# Patient Record
Sex: Female | Born: 1985 | Race: Black or African American | Hispanic: No | Marital: Married | Smoking: Never smoker
Health system: Southern US, Community
[De-identification: ages and names within clinical notes are randomized; demographics above are authoritative.]

## PROBLEM LIST (undated history)

## (undated) DIAGNOSIS — D649 Anemia, unspecified: Secondary | ICD-10-CM

## (undated) DIAGNOSIS — B009 Herpesviral infection, unspecified: Secondary | ICD-10-CM

## (undated) DIAGNOSIS — B977 Papillomavirus as the cause of diseases classified elsewhere: Secondary | ICD-10-CM

## (undated) DIAGNOSIS — D62 Acute posthemorrhagic anemia: Secondary | ICD-10-CM

## (undated) DIAGNOSIS — N870 Mild cervical dysplasia: Secondary | ICD-10-CM

## (undated) HISTORY — DX: Herpesviral infection, unspecified: B00.9

## (undated) HISTORY — DX: Papillomavirus as the cause of diseases classified elsewhere: B97.7

## (undated) HISTORY — DX: Mild cervical dysplasia: N87.0

## (undated) HISTORY — PX: HERNIA REPAIR: SHX51

---

## 2008-04-07 ENCOUNTER — Emergency Department (HOSPITAL_COMMUNITY): Admission: EM | Admit: 2008-04-07 | Discharge: 2008-04-07 | Payer: Self-pay | Admitting: Emergency Medicine

## 2008-11-12 ENCOUNTER — Ambulatory Visit: Payer: Self-pay | Admitting: Women's Health

## 2008-11-12 ENCOUNTER — Encounter: Payer: Self-pay | Admitting: Women's Health

## 2008-11-12 ENCOUNTER — Other Ambulatory Visit: Admission: RE | Admit: 2008-11-12 | Discharge: 2008-11-12 | Payer: Self-pay | Admitting: Gynecology

## 2008-12-30 ENCOUNTER — Ambulatory Visit: Payer: Self-pay | Admitting: Women's Health

## 2009-11-14 ENCOUNTER — Other Ambulatory Visit: Admission: RE | Admit: 2009-11-14 | Discharge: 2009-11-14 | Payer: Self-pay | Admitting: Gynecology

## 2009-11-14 ENCOUNTER — Ambulatory Visit: Payer: Self-pay | Admitting: Women's Health

## 2010-02-13 ENCOUNTER — Ambulatory Visit: Payer: Self-pay | Admitting: Women's Health

## 2010-11-17 ENCOUNTER — Other Ambulatory Visit (HOSPITAL_COMMUNITY)
Admission: RE | Admit: 2010-11-17 | Discharge: 2010-11-17 | Disposition: A | Discharge status: Home / Self Care | Payer: BC Managed Care – PPO | Source: Ambulatory Visit | Attending: Gynecology | Admitting: Gynecology

## 2010-11-17 ENCOUNTER — Other Ambulatory Visit: Payer: Self-pay | Admitting: Women's Health

## 2010-11-17 ENCOUNTER — Ambulatory Visit
Admission: RE | Admit: 2010-11-17 | Discharge: 2010-11-17 | Payer: Self-pay | Source: Home / Self Care | Attending: Women's Health | Admitting: Women's Health

## 2010-11-17 DIAGNOSIS — Z124 Encounter for screening for malignant neoplasm of cervix: Secondary | ICD-10-CM | POA: Insufficient documentation

## 2010-11-17 DIAGNOSIS — R87810 Cervical high risk human papillomavirus (HPV) DNA test positive: Secondary | ICD-10-CM | POA: Insufficient documentation

## 2010-11-22 DIAGNOSIS — B977 Papillomavirus as the cause of diseases classified elsewhere: Secondary | ICD-10-CM

## 2010-11-22 HISTORY — DX: Papillomavirus as the cause of diseases classified elsewhere: B97.7

## 2010-12-01 ENCOUNTER — Ambulatory Visit: Payer: Self-pay | Admitting: Gynecology

## 2010-12-08 ENCOUNTER — Ambulatory Visit (INDEPENDENT_AMBULATORY_CARE_PROVIDER_SITE_OTHER): Payer: BC Managed Care – PPO | Admitting: Gynecology

## 2010-12-08 ENCOUNTER — Other Ambulatory Visit: Payer: Self-pay | Admitting: Gynecology

## 2010-12-08 DIAGNOSIS — R87613 High grade squamous intraepithelial lesion on cytologic smear of cervix (HGSIL): Secondary | ICD-10-CM

## 2010-12-18 DIAGNOSIS — N870 Mild cervical dysplasia: Secondary | ICD-10-CM

## 2010-12-18 HISTORY — DX: Mild cervical dysplasia: N87.0

## 2011-07-04 ENCOUNTER — Encounter: Payer: Self-pay | Admitting: Anesthesiology

## 2011-07-04 DIAGNOSIS — B009 Herpesviral infection, unspecified: Secondary | ICD-10-CM | POA: Insufficient documentation

## 2011-07-12 ENCOUNTER — Encounter: Payer: Self-pay | Admitting: Gynecology

## 2011-07-12 ENCOUNTER — Ambulatory Visit (INDEPENDENT_AMBULATORY_CARE_PROVIDER_SITE_OTHER): Payer: BC Managed Care – PPO | Admitting: Gynecology

## 2011-07-12 ENCOUNTER — Other Ambulatory Visit (HOSPITAL_COMMUNITY)
Admission: RE | Admit: 2011-07-12 | Discharge: 2011-07-12 | Disposition: A | Discharge status: Home / Self Care | Payer: BC Managed Care – PPO | Source: Ambulatory Visit | Attending: Gynecology | Admitting: Gynecology

## 2011-07-12 VITALS — BP 130/70

## 2011-07-12 DIAGNOSIS — Z01419 Encounter for gynecological examination (general) (routine) without abnormal findings: Secondary | ICD-10-CM | POA: Insufficient documentation

## 2011-07-12 DIAGNOSIS — N870 Mild cervical dysplasia: Secondary | ICD-10-CM | POA: Insufficient documentation

## 2011-07-12 DIAGNOSIS — R87810 Cervical high risk human papillomavirus (HPV) DNA test positive: Secondary | ICD-10-CM | POA: Insufficient documentation

## 2011-07-12 NOTE — Progress Notes (Signed)
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 Lines were discussed with the patient and because of her young age and her compliance she will be followed up Pap smear every 6 months and re\re colposcoped if any higher grade lesion or abnormality is noted. Patient not sexually active Pap smear was repeated. She scheduled to return back in 6 months which is due for her annual exam.

## 2011-07-12 NOTE — Patient Instructions (Signed)
CIN 1 preceded by low grade cytological abnormalities  Expectant management - Since spontaneous regression is observed in most women in this setting (ASC-US, ASC-H, LSIL), expectant management is generally preferred for the reliable patient with biopsy-confirmed CIN 1, in whom the entire lesion and limits of the transformation zone are completely visualized (ie, satisfactory colposcopic examination). Approximately 80 to 85 percent of women referred for initial colposcopy because of LSIL or HPV DNA positive ASC-US have CIN 1 or less detected on biopsy [18,26]. Overall, 9 to 16 percent of these women will have histologically confirmed CIN 2 or 3 within two years of follow-up. Fewer women with ASC-H have only CIN 1 or less detected at initial biopsy, but for those who do, the risk of subsequent progression to CIN 2,3 is 11 percent [27]. Therefore, an effective method for monitoring patients managed expectantly is important. The only randomized trial evaluating approaches to postcolposcopy monitoring of these patients concluded that HPV testing after 12 months with repeat colposcopic referral if the HPV results are positive for high risk types was more efficient than semiannual cytology or combined cytology and high risk HPV DNA testing [26]. Sensitivity to detect subsequent CIN 2,3 for a single HPV DNA test at 12 months was 92 percent, with a 55 percent rate of colposcopy referral. To achieve comparable sensitivity with the other follow-up methods, more visits would be required and with a higher rate of colposcopy referral, 64 to 70 percent. Based on these data, the American Society for Colposcopy and Cervical Pathology (ASCCP) consensus guidelines recommend expectant management of women with biopsy confirmed CIN 1 using follow-up with HPV testing at 12 months or repeat cytology at 6 and 12 months [1]. In addition: Colposcopy should be repeated if repeat cytology shows ASC or greater or HPV DNA testing is  positive for a high risk type.  After two negative smears or a negative HPV DNA test, routine screening may be resumed. Follow-up of women with CIN 1 beyond 24 months has shown that spontaneous regression or progression can occur [28]. There are no data to suggest that it is unsafe to continue close clinical follow-up of a compliant patient with persistent CIN 1, with treatment planned if there is evidence of disease progression or if the women chooses to be treated. CIN 2,3 are managed as described below (

## 2012-01-14 ENCOUNTER — Ambulatory Visit (INDEPENDENT_AMBULATORY_CARE_PROVIDER_SITE_OTHER): Payer: BC Managed Care – PPO | Admitting: Gynecology

## 2012-01-14 ENCOUNTER — Encounter: Payer: Self-pay | Admitting: Gynecology

## 2012-01-14 ENCOUNTER — Other Ambulatory Visit (HOSPITAL_COMMUNITY)
Admission: RE | Admit: 2012-01-14 | Discharge: 2012-01-14 | Disposition: A | Discharge status: Home / Self Care | Payer: BC Managed Care – PPO | Source: Ambulatory Visit | Attending: Gynecology | Admitting: Gynecology

## 2012-01-14 VITALS — BP 110/70 | Ht 61.5 in | Wt 114.0 lb

## 2012-01-14 DIAGNOSIS — Z01419 Encounter for gynecological examination (general) (routine) without abnormal findings: Secondary | ICD-10-CM | POA: Insufficient documentation

## 2012-01-14 DIAGNOSIS — IMO0001 Reserved for inherently not codable concepts without codable children: Secondary | ICD-10-CM

## 2012-01-14 DIAGNOSIS — R87810 Cervical high risk human papillomavirus (HPV) DNA test positive: Secondary | ICD-10-CM | POA: Insufficient documentation

## 2012-01-14 DIAGNOSIS — R350 Frequency of micturition: Secondary | ICD-10-CM

## 2012-01-14 DIAGNOSIS — N870 Mild cervical dysplasia: Secondary | ICD-10-CM

## 2012-01-14 LAB — URINALYSIS W MICROSCOPIC + REFLEX CULTURE
Bilirubin Urine: NEGATIVE
Glucose, UA: NEGATIVE mg/dL
Leukocytes, UA: NEGATIVE
Specific Gravity, Urine: 1.015 (ref 1.005–1.030)
pH: 6 (ref 5.0–8.0)

## 2012-01-14 NOTE — Progress Notes (Signed)
Charlotte Giles June 18, 1986 [id number]   History:    26 y.o.  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 Patient having normal menstrual cycles  Past medical history,surgical history, family history and social history were all reviewed and documented in the EPIC chart.  Gynecologic History Patient's last menstrual period was 12/19/2011. Contraception: none Last Pap: See above. Results were: See above Last mammogram: Not indicated. Results were: Not indicated  Obstetric History OB History    Grav Para Term Preterm Abortions TAB SAB Ect Mult Living   0                ROS:  Was performed and pertinent positives and negatives are included in the history.  Exam: chaperone present  BP 110/70  Ht 5' 1.5" (1.562 m)  Wt 114 lb (51.71 kg)  BMI 21.19 kg/m2  LMP 12/19/2011  Body mass index is 21.19 kg/(m^2).  General appearance : Well developed well nourished female. No acute distress HEENT: Neck supple, trachea midline, no carotid bruits, no thyroidmegaly Lungs: Clear to auscultation, no rhonchi or wheezes, or rib retractions  Heart: Regular rate and rhythm, no murmurs or gallops Breast:Examined in sitting and supine position were symmetrical in appearance, no palpable masses or tenderness,  no skin retraction, no nipple inversion, no nipple discharge, no skin discoloration, no axillary or supraclavicular lymphadenopathy Abdomen: no palpable masses or tenderness, no rebound or guarding Extremities: no edema or skin discoloration or tenderness  Pelvic:  Bartholin, Urethra, Skene Glands: Within normal limits             Vagina: No gross lesions or  discharge  Cervix: No gross lesions or discharge  Uterus  anteverted, normal size, shape and consistency, non-tender and mobile  Adnexa  Without masses or tenderness  Anus and perineum  normal   Rectovaginal  normal sphincter tone without palpated masses or tenderness             Hemoccult not done     Assessment/Plan:  26 y.o. female for annual exam with history of CIN-1. New guidelines and close surveillance with Pap smears every 6 months of been discussed. Pap smear was done today. Will notify with the results and continue to follow every 6 months for 2 years unless there is still persistent dysplasia at that time and or if prior to that a high-grade dysplasia seen on Pap smear. CBC, cholesterol, urinalysis was also done today. She was encouraged to do her monthly self breast examination. Of note she has completed [place] vaccine in the past. All questions were answered we'll follow accordingly.    Charlotte Edwards MD, 3:06 PM 01/14/2012

## 2012-01-14 NOTE — Patient Instructions (Signed)
Will let you know results of pap in less than a week

## 2012-01-15 LAB — CBC WITH DIFFERENTIAL/PLATELET
Basophils Absolute: 0 10*3/uL (ref 0.0–0.1)
Eosinophils Relative: 1 % (ref 0–5)
HCT: 39.4 % (ref 36.0–46.0)
Hemoglobin: 12.9 g/dL (ref 12.0–15.0)
Lymphocytes Relative: 34 % (ref 12–46)
Lymphs Abs: 2.8 10*3/uL (ref 0.7–4.0)
MCV: 89.5 fL (ref 78.0–100.0)
Monocytes Absolute: 0.8 10*3/uL (ref 0.1–1.0)
Monocytes Relative: 10 % (ref 3–12)
Neutro Abs: 4.6 10*3/uL (ref 1.7–7.7)
RBC: 4.4 MIL/uL (ref 3.87–5.11)
RDW: 12.8 % (ref 11.5–15.5)
WBC: 8.4 10*3/uL (ref 4.0–10.5)

## 2012-07-06 ENCOUNTER — Encounter (HOSPITAL_BASED_OUTPATIENT_CLINIC_OR_DEPARTMENT_OTHER): Payer: Self-pay | Admitting: *Deleted

## 2012-07-06 ENCOUNTER — Emergency Department (HOSPITAL_BASED_OUTPATIENT_CLINIC_OR_DEPARTMENT_OTHER)
Admission: EM | Admit: 2012-07-06 | Discharge: 2012-07-06 | Disposition: A | Discharge status: Home / Self Care | Payer: BC Managed Care – PPO | Attending: Emergency Medicine | Admitting: Emergency Medicine

## 2012-07-06 DIAGNOSIS — Z833 Family history of diabetes mellitus: Secondary | ICD-10-CM | POA: Insufficient documentation

## 2012-07-06 DIAGNOSIS — Z8042 Family history of malignant neoplasm of prostate: Secondary | ICD-10-CM | POA: Insufficient documentation

## 2012-07-06 DIAGNOSIS — R112 Nausea with vomiting, unspecified: Secondary | ICD-10-CM | POA: Insufficient documentation

## 2012-07-06 DIAGNOSIS — Z8249 Family history of ischemic heart disease and other diseases of the circulatory system: Secondary | ICD-10-CM | POA: Insufficient documentation

## 2012-07-06 DIAGNOSIS — R197 Diarrhea, unspecified: Secondary | ICD-10-CM | POA: Insufficient documentation

## 2012-07-06 LAB — URINALYSIS, ROUTINE W REFLEX MICROSCOPIC
Bilirubin Urine: NEGATIVE
Glucose, UA: NEGATIVE mg/dL
Hgb urine dipstick: NEGATIVE
Ketones, ur: >80 mg/dL — AB
pH: 5.5 (ref 5.0–8.0)

## 2012-07-06 LAB — COMPREHENSIVE METABOLIC PANEL
ALT: 16 U/L (ref 0–35)
AST: 38 U/L — ABNORMAL HIGH (ref 0–37)
Albumin: 4.3 g/dL (ref 3.5–5.2)
Alkaline Phosphatase: 47 U/L (ref 39–117)
BUN: 7 mg/dL (ref 6–23)
CO2: 24 mEq/L (ref 19–32)
Calcium: 9.8 mg/dL (ref 8.4–10.5)
Chloride: 99 mEq/L (ref 96–112)
Creatinine, Ser: 0.8 mg/dL (ref 0.50–1.10)
GFR calc Af Amer: >90 mL/min (ref >90)
GFR calc non Af Amer: >90 mL/min (ref >90)
Glucose, Bld: 89 mg/dL (ref 70–99)
Potassium: 3.9 mEq/L (ref 3.5–5.1)
Sodium: 136 mEq/L (ref 135–145)
Total Bilirubin: 0.7 mg/dL (ref 0.3–1.2)
Total Protein: 7.7 g/dL (ref 6.0–8.3)

## 2012-07-06 LAB — CBC WITH DIFFERENTIAL/PLATELET
Basophils Absolute: 0 10*3/uL (ref 0.0–0.1)
Basophils Relative: 0 % (ref 0–1)
Eosinophils Absolute: 0 10*3/uL (ref 0.0–0.7)
Eosinophils Relative: 0 % (ref 0–5)
HCT: 35.8 % — ABNORMAL LOW (ref 36.0–46.0)
Hemoglobin: 12.4 g/dL (ref 12.0–15.0)
Lymphocytes Relative: 6 % — ABNORMAL LOW (ref 12–46)
Lymphs Abs: 0.9 10*3/uL (ref 0.7–4.0)
MCH: 29.7 pg (ref 26.0–34.0)
MCHC: 34.6 g/dL (ref 30.0–36.0)
MCV: 85.6 fL (ref 78.0–100.0)
Monocytes Absolute: 1.1 10*3/uL — ABNORMAL HIGH (ref 0.1–1.0)
Monocytes Relative: 8 % (ref 3–12)
Neutro Abs: 13 10*3/uL — ABNORMAL HIGH (ref 1.7–7.7)
Neutrophils Relative %: 87 % — ABNORMAL HIGH (ref 43–77)
Platelets: 202 10*3/uL (ref 150–400)
RBC: 4.18 MIL/uL (ref 3.87–5.11)
RDW: 11.9 % (ref 11.5–15.5)
WBC: 14.9 10*3/uL — ABNORMAL HIGH (ref 4.0–10.5)

## 2012-07-06 MED ORDER — ONDANSETRON 4 MG PO TBDP: 4.0000 mg | ORAL_TABLET | Freq: Three times a day (TID) | ORAL | Status: AC | PRN

## 2012-07-06 MED ORDER — SODIUM CHLORIDE 0.9 % IV SOLN
Freq: Once | INTRAVENOUS | Status: AC
  Administered 2012-07-06: 17:00:00 via INTRAVENOUS

## 2012-07-06 MED ORDER — ONDANSETRON HCL 4 MG/2ML IJ SOLN
4.0000 mg | Freq: Once | INTRAMUSCULAR | Status: AC
  Administered 2012-07-06: 4 mg via INTRAVENOUS
  Filled 2012-07-06: qty 2

## 2012-07-06 MED ORDER — GI COCKTAIL ~~LOC~~
30.0000 mL | Freq: Once | ORAL | Status: AC
  Administered 2012-07-06: 30 mL via ORAL
  Filled 2012-07-06: qty 30

## 2012-07-06 NOTE — ED Notes (Signed)
Pt states she has had epigastric and generalized abd pain, N/V/D since last p.m. Denies urinary s/s or vaginal d/c

## 2012-07-06 NOTE — ED Provider Notes (Signed)
History     CSN: [id number]  Arrival date & time 07/06/12  1454   First MD Initiated Contact with Patient 07/06/12 [phone]      Chief Complaint  Patient presents with  . Abdominal Pain    (Consider location/radiation/quality/duration/timing/severity/associated sxs/prior treatment) Patient is a 26 y.o. female presenting with abdominal pain. The history is provided by the patient. No language interpreter was used.  Abdominal Pain The primary symptoms of the illness include abdominal pain, nausea, vomiting and diarrhea. The current episode started yesterday. The onset of the illness was gradual. The problem has been gradually improving.  Associated with: nothing. The patient states that she believes she is currently not pregnant. Risk factors: none. Additional symptoms associated with the illness include chills. Significant associated medical issues do not include PUD.  Pt complains of pain vomitting and diarrhea.  Pt complains of abdominal cramping  Past Medical History  Diagnosis Date  . 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 No pertinent past surgical history.  Family History  Problem Relation Age of Onset  . Hypertension Mother   . Cancer Father 37    PROSTATE  . Diabetes Maternal Grandmother   . Hypertension Maternal Grandmother     History  Substance Use Topics  . Smoking status: Never Smoker   . Smokeless tobacco: Never Used  . Alcohol Use: Yes     OCCASIONALLY    OB History    Grav Para Term Preterm Abortions TAB SAB Ect Mult Living   0               Review of Systems  Constitutional: Positive for chills.  Gastrointestinal: Positive for nausea, vomiting, abdominal pain and diarrhea.  All other systems reviewed and are negative.    Allergies  Review of patient's allergies indicates no known allergies.  Home Medications   Current Outpatient Rx  Name Route Sig Dispense Refill  .  ACETAMINOPHEN 325 MG PO TABS Oral Take 325 mg by mouth every 6 (six) hours as needed. For headache.    Marland Kitchen BISMUTH SUBSALICYLATE 262 MG/15ML PO SUSP Oral Take 15 mLs by mouth every 6 (six) hours as needed. For upset stomach.    . ONE-DAILY MULTI VITAMINS PO TABS Oral Take 1 tablet by mouth daily.       BP 119/68  Pulse 100  Temp 98 F (36.7 C) (Oral)  Resp 18  Ht 5\' 1"  (1.549 m)  Wt 110 lb (49.896 kg)  BMI 20.78 kg/m2  SpO2 100%  LMP 06/06/2012  Physical Exam  Nursing note and vitals reviewed. Constitutional: She is oriented to person, place, and time. She appears well-developed and well-nourished.  HENT:  Head: Normocephalic and atraumatic.  Right Ear: External ear normal.  Left Ear: External ear normal.  Nose: Nose normal.  Mouth/Throat: Oropharynx is clear and moist.  Eyes: Conjunctivae normal and EOM are normal. Pupils are equal, round, and reactive to light.  Neck: Normal range of motion. Neck supple.  Cardiovascular: Normal rate and normal heart sounds.   Pulmonary/Chest: Effort normal and breath sounds normal.  Abdominal: Bowel sounds are normal. There is tenderness.  Musculoskeletal: Normal range of motion.  Neurological: She is alert and oriented to person, place, and time.  Skin: Skin is warm.  Psychiatric: She has a normal mood and affect.    ED Course  Procedures (including critical care time)  Labs Reviewed  URINALYSIS, ROUTINE W REFLEX  MICROSCOPIC - Abnormal; Notable for the following:    Ketones, ur >80 (*)     All other components within normal limits  CBC WITH DIFFERENTIAL - Abnormal; Notable for the following:    WBC 14.9 (*)     HCT 35.8 (*)     Neutrophils Relative 87 (*)     Neutro Abs 13.0 (*)     Lymphocytes Relative 6 (*)     Monocytes Absolute 1.1 (*)     All other components within normal limits  COMPREHENSIVE METABOLIC PANEL - Abnormal; Notable for the following:    AST 38 (*)     All other components within normal limits  PREGNANCY,  URINE   No results found.   1. Nausea, vomiting and diarrhea       MDM  Pt given Iv fluids x 2 liters,  zofran and gi cocktail relieved cramping and discomfort.   I counseled pt on results        Lonia Skinner [place], [place] 07/07/12 0032  Elson Areas, [place] 07/07/12 [phone]

## 2012-07-07 NOTE — ED Provider Notes (Signed)
Medical screening examination/treatment/procedure(s) were performed by non-physician practitioner and as supervising physician I was immediately available for consultation/collaboration.   Tobin Chad, MD 07/07/12 0040

## 2012-08-12 ENCOUNTER — Encounter: Payer: Self-pay | Admitting: Gynecology

## 2012-08-12 ENCOUNTER — Other Ambulatory Visit (HOSPITAL_COMMUNITY)
Admission: RE | Admit: 2012-08-12 | Discharge: 2012-08-12 | Disposition: A | Discharge status: Home / Self Care | Payer: BC Managed Care – PPO | Source: Ambulatory Visit | Attending: Gynecology | Admitting: Gynecology

## 2012-08-12 ENCOUNTER — Ambulatory Visit (INDEPENDENT_AMBULATORY_CARE_PROVIDER_SITE_OTHER): Payer: BC Managed Care – PPO | Admitting: Gynecology

## 2012-08-12 VITALS — BP 110/70

## 2012-08-12 DIAGNOSIS — Z01419 Encounter for gynecological examination (general) (routine) without abnormal findings: Secondary | ICD-10-CM | POA: Insufficient documentation

## 2012-08-12 DIAGNOSIS — N870 Mild cervical dysplasia: Secondary | ICD-10-CM

## 2012-08-12 NOTE — Progress Notes (Signed)
Patient is a 26 year old who presented to the office for followup Pap smear. She recently got married in August of this year. 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 She will be receiving her flu vaccine at work.  Pap smear repeated  Assessment/plan: Patient was CIN-1 (low-grade dysplasia with high-risk HPV) we'll continue to monitor with Pap smear in 6 month. She is due to return in for annual exam. After that Pap smear we may want to followup with the guidelines and followup Pap smears yearly.

## 2012-11-11 LAB — OB RESULTS CONSOLE GBS: STREP GROUP B AG: NEGATIVE

## 2013-01-14 ENCOUNTER — Ambulatory Visit (INDEPENDENT_AMBULATORY_CARE_PROVIDER_SITE_OTHER): Payer: BC Managed Care – PPO | Admitting: Gynecology

## 2013-01-14 ENCOUNTER — Encounter: Payer: Self-pay | Admitting: Gynecology

## 2013-01-14 VITALS — BP 122/80 | Ht 62.25 in | Wt 113.0 lb

## 2013-01-14 DIAGNOSIS — Z3169 Encounter for other general counseling and advice on procreation: Secondary | ICD-10-CM

## 2013-01-14 DIAGNOSIS — N870 Mild cervical dysplasia: Secondary | ICD-10-CM

## 2013-01-14 DIAGNOSIS — Z01419 Encounter for gynecological examination (general) (routine) without abnormal findings: Secondary | ICD-10-CM

## 2013-01-14 LAB — CBC WITH DIFFERENTIAL/PLATELET
Basophils Absolute: 0 10*3/uL (ref 0.0–0.1)
Eosinophils Absolute: 0.1 10*3/uL (ref 0.0–0.7)
Eosinophils Relative: 1 % (ref 0–5)
HCT: 38.1 % (ref 36.0–46.0)
Lymphocytes Relative: 26 % (ref 12–46)
MCH: 28.8 pg (ref 26.0–34.0)
MCHC: 34.1 g/dL (ref 30.0–36.0)
MCV: 84.5 fL (ref 78.0–100.0)
Monocytes Absolute: 0.9 10*3/uL (ref 0.1–1.0)
Platelets: 309 10*3/uL (ref 150–400)
RDW: 13.4 % (ref 11.5–15.5)

## 2013-01-14 NOTE — Progress Notes (Signed)
Charlotte Giles 07-15-1986 [id number]   History:    27 y.o.  for annual gyn exam with no complaints. Patient interested in attempting to get pregnant. She stopped using any form of contraception this past January. She states her cycles are regular. 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  Gynecologic History Patient's last menstrual period was 12/29/2012. Contraception: none Last Pap: see above. Results were: see above Last mammogram: not indicated. Results were: not indicated  Obstetric History OB History   Grav Para Term Preterm Abortions TAB SAB Ect Mult Living   0                ROS: A ROS was performed and pertinent positives and negatives are included in the history.  GENERAL: No fevers or chills. HEENT: No change in vision, no earache, sore throat or sinus congestion. NECK: No pain or stiffness. CARDIOVASCULAR: No chest pain or pressure. No palpitations. PULMONARY: No shortness of breath, cough or wheeze. GASTROINTESTINAL: No abdominal pain, nausea, vomiting or diarrhea, melena or bright red blood per rectum. GENITOURINARY: No urinary frequency, urgency, hesitancy or dysuria. MUSCULOSKELETAL: No joint or muscle pain, no back pain, no recent trauma. DERMATOLOGIC: No rash, no itching, no lesions. ENDOCRINE: No polyuria, polydipsia, no heat or cold intolerance. No recent change in weight. HEMATOLOGICAL: No anemia or easy bruising or bleeding. NEUROLOGIC: No  headache, seizures, numbness, tingling or weakness. PSYCHIATRIC: No depression, no loss of interest in normal activity or change in sleep pattern.     Exam: chaperone present  BP 122/80  Ht 5' 2.25" (1.581 m)  Wt 113 lb (51.256 kg)  BMI 20.51 kg/m2  LMP 12/29/2012  Body mass index is 20.51 kg/(m^2).  General appearance : Well developed well nourished female. No acute distress HEENT: Neck supple, trachea midline, no carotid bruits, no thyroidmegaly Lungs: Clear to auscultation, no rhonchi or wheezes, or rib retractions  Heart: Regular rate and rhythm, no murmurs or gallops Breast:Examined in sitting and supine position were symmetrical in appearance, no palpable masses or tenderness,  no skin retraction, no nipple inversion, no nipple discharge, no skin discoloration, no axillary or supraclavicular lymphadenopathy Abdomen: no palpable masses or tenderness, no rebound or guarding Extremities: no edema or skin discoloration or tenderness  Pelvic:  Bartholin, Urethra, Skene Glands: Within normal limits             Vagina: No gross lesions or discharge  Cervix: No gross lesions or discharge  Uterus  anteverted, normal size, shape and consistency, non-tender and mobile  Adnexa  Without masses or tenderness  Anus and perineum  normal   Rectovaginal  normal sphincter tone without palpated masses or tenderness             Hemoccult not done     Assessment/Plan:  27 y.o. female for annual exam who is interested in conceiving this year. We had a lengthy preconceptual counseling session. Patient denies any family history of any birth defects in either her side of the family  or her husband. We  discussed the utilization of ovulation predictor kit for timing of intercourse. She'll be started on prenatal vitamins. No Pap smear done today due to guidelines discussed. CBC and urinalysis was obtained today.    [name] MD, 2:48 PM 01/14/2013

## 2013-01-14 NOTE — Patient Instructions (Addendum)
Patient information: How to plan and prepare for a healthy pregnancy (The Basics)    Should I see a doctor before I try to get pregnant? -- Yes. It's very important that you see your doctor or nurse for a “pre-pregnancy check-up.” Your doctor or nurse will ask you about things that could affect your pregnancy. For instance, he or she might ask about your diet, lifestyle, use of birth control, past pregnancies, medicines, and any diseases that you have or that run in your family.    There are several things that you and your doctor or nurse can do to make sure that your pregnancy is as healthy as possible. These things should be done BEFORE you try to get pregnant:  ?Discuss any medicines or herbal drugs you take and find out if you need to make changes  ?Discuss whether you are up-to-date on your vaccines  ?Start taking a multivitamin that has folic acid (also called folate)  ?Know which foods you should avoid and which foods are best   ?Stop smoking, drinking alcohol, or taking drugs not prescribed for you by a doctor  ?Understand the risks to you and your baby if:  •You have any medical conditions  •There are diseases that run in your family or your partner's family  ?Discuss whether there are any harmful substances in your home or work  ?Try to reach a healthy weight    Each of these issues is explained in more detail below.  Ask if the medicines you take are safe -- If you take any medicines, supplements, or herbal drugs, ask your doctor if it is safe to keep taking them while you are pregnant or trying to get pregnant. Some medicines take a long time to leave your body completely, so it's important to plan ahead. In some cases, your doctor and nurse will want you to switch to different medicines that are safer for the baby. Your doctor and nurse might need to slowly get you off some medicines because it could harm you to stop them all of a sudden. This is especially important for women who take medicines to  treat seizures, high blood pressure, lupus, and rheumatoid arthritis.   Check if you need any vaccines -- Women who want to get pregnant should be up-to-date on their vaccines. This includes vaccines against measles, mumps, rubella, tetanus, diphtheria, polio, chickenpox (also called varicella), and possibly hepatitis. Many women got these vaccines as children. Still, it is important to check that you have had all the right vaccines. Otherwise, you could get sick with the diseases the vaccines protect against, and that could cause problems for you or your baby. All women should also get a flu shot every year.  Some vaccines cannot be given during pregnancy or in the month before pregnancy. It's important to get these vaccines more than a month before you start trying to get pregnant.  Start taking a multivitamin -- If you want to get pregnant, take a “prenatal” multivitamin every day that has at least 400 micrograms of folic acid. This helps prevent some birth defects. Start taking the multivitamin at least a month before you start trying to get pregnant. It’s not enough to start taking vitamins when you find out you are pregnant. At that point, your baby has already formed many body parts that rely on folic acid and other vitamins to develop normally.   It is important not to take too much of any vitamin during pregnancy, especially vitamin A.   Show your doctor or nurse the vitamins you plan to take to make sure the doses are safe for you and your baby.  Check your diet -- Some foods are not safe for a woman who is pregnant or trying to get pregnant. If you are trying to get pregnant, do not eat raw or undercooked meat. Avoid eating shark, swordfish, king mackerel, or tilefish because they can have high levels of mercury. Check with your doctor or nurse about the safety of fish caught in local rivers and lakes. Limit the amount of caffeine you have by not drinking more than 1 or 2 cups of coffee each day. Tea and  cola also contain caffeine, but usually not as much as coffee. Try to eat a balanced diet rich in fruits, vegetables, and whole grains. Wash fruits and vegetables before eating them.   Stop smoking, drinking alcohol, or taking illegal drugs -- If you smoke, drink alcohol, or take drugs not prescribed for you by a doctor, now more than ever it is important that you stop. Using even small amounts of these substances from time to time during pregnancy could hurt your baby.  It’s not enough to stop as soon as you find out you are pregnant. By then the baby has already begun to form and could get damaged by smoking, alcohol, or drugs. If you need help quitting, speak with your doctor or nurse. There are effective treatments that can help.    Your partner should also stop smoking and using illegal drugs. He should not drink too much alcohol.    Ask about risks -- Ask your doctor what the risks to you and your baby might be if:    ?You have any medical conditions - If you have a medical problem, it could cause problems for you or your baby during pregnancy. Women who have certain medical conditions should work with their doctor to get their conditions under control before they get pregnant. This includes women with diabetes, high blood pressure, asthma, thyroid conditions, seizure disorders, HIV infection, and other problems. If these conditions are not well controlled, they can cause problems for a mother and her baby during pregnancy.     ?You or your partner has a family history of a medical condition - If you or your partner has a history of a condition that could be passed on to your baby, you might need genetic counseling. Genetic counseling can help you find out what the chances are that your baby will have the condition. It will also help you sort out what your options might be if your baby does have problems. Examples of conditions that might call for genetic counseling include cystic fibrosis, mental retardation,  and muscular dystrophy.    Check your home and work for harmful substances -- People often have chemicals or substances in their home or work that could hurt an unborn baby. Dealing with these substances can sometimes be complicated and time consuming, so it’s important to plan ahead. For instance, people who live in homes built before 1978 often have lead paint on their walls or woodwork. Lead in chips or dust from this paint could harm a baby. Ask your doctor or nurse how to deal with this and other harmful substances you might have around you.     Work on your weight -- Women who weigh too little or too much can have problems getting pregnant and problems during pregnancy. You should try to reach a healthy weight before

## 2013-01-15 LAB — URINALYSIS W MICROSCOPIC + REFLEX CULTURE
Bacteria, UA: NONE SEEN
Bilirubin Urine: NEGATIVE
Casts: NONE SEEN
Crystals: NONE SEEN
Hgb urine dipstick: NEGATIVE
Ketones, ur: NEGATIVE mg/dL
Specific Gravity, Urine: 1.011 (ref 1.005–1.030)
Urobilinogen, UA: 0.2 mg/dL (ref 0.0–1.0)

## 2013-03-02 LAB — OB RESULTS CONSOLE GC/CHLAMYDIA
CHLAMYDIA, DNA PROBE: NEGATIVE
GC PROBE AMP, GENITAL: NEGATIVE

## 2013-03-02 LAB — OB RESULTS CONSOLE ABO/RH

## 2013-04-14 ENCOUNTER — Ambulatory Visit (INDEPENDENT_AMBULATORY_CARE_PROVIDER_SITE_OTHER): Payer: BC Managed Care – PPO

## 2013-04-14 ENCOUNTER — Ambulatory Visit (INDEPENDENT_AMBULATORY_CARE_PROVIDER_SITE_OTHER): Payer: BC Managed Care – PPO | Admitting: Gynecology

## 2013-04-14 ENCOUNTER — Other Ambulatory Visit: Payer: Self-pay | Admitting: Gynecology

## 2013-04-14 ENCOUNTER — Encounter: Payer: Self-pay | Admitting: Gynecology

## 2013-04-14 VITALS — BP 106/68

## 2013-04-14 DIAGNOSIS — Z3490 Encounter for supervision of normal pregnancy, unspecified, unspecified trimester: Secondary | ICD-10-CM

## 2013-04-14 DIAGNOSIS — O200 Threatened abortion: Secondary | ICD-10-CM

## 2013-04-14 DIAGNOSIS — N912 Amenorrhea, unspecified: Secondary | ICD-10-CM

## 2013-04-14 DIAGNOSIS — N83201 Unspecified ovarian cyst, right side: Secondary | ICD-10-CM

## 2013-04-14 DIAGNOSIS — N83209 Unspecified ovarian cyst, unspecified side: Secondary | ICD-10-CM

## 2013-04-14 DIAGNOSIS — O43899 Other placental disorders, unspecified trimester: Secondary | ICD-10-CM

## 2013-04-14 DIAGNOSIS — N831 Corpus luteum cyst of ovary, unspecified side: Secondary | ICD-10-CM

## 2013-04-14 DIAGNOSIS — O468X1 Other antepartum hemorrhage, first trimester: Secondary | ICD-10-CM | POA: Insufficient documentation

## 2013-04-14 DIAGNOSIS — O418X10 Other specified disorders of amniotic fluid and membranes, first trimester, not applicable or unspecified: Secondary | ICD-10-CM

## 2013-04-14 LAB — PREGNANCY, URINE: Preg Test, Ur: POSITIVE

## 2013-04-14 LAB — US OB TRANSVAGINAL

## 2013-04-14 NOTE — Patient Instructions (Signed)

## 2013-04-14 NOTE — Progress Notes (Signed)
27 year old  No gravida 1 who presented to the office today because of a positive pregnancy test at home.she stated her last menstrual period 03/02/2013 and that her cycles have been regular. She had been on prenatal vitamins and had been trying to get pregnant. Patient denies any past history of GC or Chlamydia or pelvic surgery. She has had history of dysplasia which has cleared recently. She denies any nausea or vomiting just some slight breast tenderness.  Exam: Bartholin's urethra Skene's within normal limits Vagina: No lesions or discharge Cervix: No lesions or discharge Uterus anteverted six-week size nontender Adnexa: No palpable mass or tenderness Rectal exam not done  Urine pregnancy test here in the office confirmed that it is positive.  Ultrasound today with the following findings:  Intrauterine pregnancy was seen in the fundus. Fetal pole and cardiac activity was seen. Size less than dates by 4 days. Crown-rump length equaled 5 weeks and 4 days. Cardiac activity was noted at 94 beats per minute. A subchorionic hematoma surrounding gestational sac measuring 28 x 8 x 22 mm was noted. The cervix is long closed. Your sac was normal. Left ovary normal right ovary echo free cyst measuring 53 x 55 mm was negative color flow. Small corpus luteum cyst measuring 31 x 37 mm was positive color flow and the periphery was noted.  Assessment 4/plan: Viable intrauterine pregnancy at 6 weeks and 1 day by last menstrual period 5 weeks and 4 days by ultrasound corrected estimated date of confinement 12/11/2013. Fascia was small subchorionic hematoma and a right ovarian cyst. Threatened AB precautions provided. Patient will return back in 2 weeks for followup ultrasound. She will continue on her prenatal vitamin.

## 2013-05-08 ENCOUNTER — Other Ambulatory Visit: Payer: BC Managed Care – PPO

## 2013-05-08 ENCOUNTER — Ambulatory Visit: Payer: BC Managed Care – PPO | Admitting: Gynecology

## 2013-05-13 ENCOUNTER — Ambulatory Visit (INDEPENDENT_AMBULATORY_CARE_PROVIDER_SITE_OTHER): Payer: BC Managed Care – PPO | Admitting: Gynecology

## 2013-05-13 ENCOUNTER — Ambulatory Visit (INDEPENDENT_AMBULATORY_CARE_PROVIDER_SITE_OTHER): Payer: BC Managed Care – PPO

## 2013-05-13 DIAGNOSIS — O4591 Premature separation of placenta, unspecified, first trimester: Secondary | ICD-10-CM

## 2013-05-13 DIAGNOSIS — N912 Amenorrhea, unspecified: Secondary | ICD-10-CM

## 2013-05-13 DIAGNOSIS — O9989 Other specified diseases and conditions complicating pregnancy, childbirth and the puerperium: Secondary | ICD-10-CM

## 2013-05-13 DIAGNOSIS — N83209 Unspecified ovarian cyst, unspecified side: Secondary | ICD-10-CM

## 2013-05-13 DIAGNOSIS — Z3490 Encounter for supervision of normal pregnancy, unspecified, unspecified trimester: Secondary | ICD-10-CM

## 2013-05-13 DIAGNOSIS — O4590 Premature separation of placenta, unspecified, unspecified trimester: Secondary | ICD-10-CM

## 2013-05-13 DIAGNOSIS — N83201 Unspecified ovarian cyst, right side: Secondary | ICD-10-CM

## 2013-05-13 LAB — US OB TRANSVAGINAL

## 2013-05-13 MED ORDER — ONDANSETRON 4 MG PO TBDP: 4.0000 mg | ORAL_TABLET | Freq: Three times a day (TID) | ORAL | Status: DC | PRN

## 2013-05-13 NOTE — Progress Notes (Signed)
Patient is a 27 year old now gravida 1 para 0 who was seen in the office on June 26 stating that she had had a positive pregnancy test at home. She reports her last menstrual period 03/02/2013 and regular cycles. She had been on prenatal vitamins that she was trying to conceive.Patient denies any past history of GC or Chlamydia or pelvic surgery. She has had history of dysplasia which has cleared recently.  She presented to the office today for ultrasound. She was complaining of some nausea and some vomiting and stated that a friend of hers had Zofran and she took a few to help her.  Ultrasound 04/14/2013: Intrauterine pregnancy was seen in the fundus. Fetal pole and cardiac activity was seen. Size less than dates by 4 days. Crown-rump length equaled 5 weeks and 4 days. Cardiac activity was noted at 94 beats per minute. A subchorionic hematoma surrounding gestational sac measuring 28 x 8 x 22 mm was noted. The cervix is long closed. Your sac was normal. Left ovary normal right ovary echo free cyst measuring 53 x 55 mm was negative color flow. Small corpus luteum cyst measuring 31 x 37 mm was positive color flow and the periphery was noted.  Ultrasound 05/13/2013: Viable intrauterine pregnancy was noted at the fundus. Size less than dates by 3 days only. Fetal pole and cardiac activity was evident. Fetal pole consistent with 9 weeks and 6 days. Fetal cardiac activity recorded 152 beats per minute. Previous subchorionic hematoma not seen. Right corpus luteum cyst measuring 26 x 19 mm was noted along with a thin wall cyst measuring 38 x 34 x 55 mm which has decreased in size from previous scan and also avascular. Internal thin septum with echogenic floating debris was also noted. The left over was normal. Cervix is long closed.  Assessment/plan: Patient with first trimester intrauterine pregnancy size consistent with dates. Estimated date of confinement 12/07/2013. Patient to continue prenatal vitamin.  Prescription for Zofran 4 mg to take 1 by mouth every 6-8 hours was provided. She will followup with my obstetrical colleagues and we will forward a copy of this office note.

## 2013-05-13 NOTE — Patient Instructions (Addendum)
Ondansetron oral dissolving tablet What is this medicine? ONDANSETRON (on DAN se tron) is used to treat nausea and vomiting caused by chemotherapy. It is also used to prevent or treat nausea and vomiting after surgery. This medicine may be used for other purposes; ask your health care provider or pharmacist if you have questions. What should I tell my health care provider before I take this medicine? They need to know if you have any of these conditions: -heart disease -history of irregular heartbeat -liver disease -low levels of magnesium or potassium in the blood -an unusual or allergic reaction to ondansetron, granisetron, other medicines, foods, dyes, or preservatives -pregnant or trying to get pregnant -breast-feeding How should I use this medicine? These tablets are made to dissolve in the mouth. Do not try to push the tablet through the foil backing. With dry hands, peel away the foil backing and gently remove the tablet. Place the tablet in the mouth and allow it to dissolve, then swallow. While you may take these tablets with water, it is not necessary to do so. Talk to your pediatrician regarding the use of this medicine in children. Special care may be needed. Overdosage: If you think you have taken too much of this medicine contact a poison control center or emergency room at once. NOTE: This medicine is only for you. Do not share this medicine with others. What if I miss a dose? If you miss a dose, take it as soon as you can. If it is almost time for your next dose, take only that dose. Do not take double or extra doses. What may interact with this medicine? Do not take this medicine with any of the following medications: -apomorphine  This medicine may also interact with the following medications: -carbamazepine -phenytoin -rifampicin -tramadol This list may not describe all possible interactions. Give your health care provider a list of all the medicines, herbs,  non-prescription drugs, or dietary supplements you use. Also tell them if you smoke, drink alcohol, or use illegal drugs. Some items may interact with your medicine. What should I watch for while using this medicine? Check with your doctor or health care professional as soon as you can if you have any sign of an allergic reaction. What side effects may I notice from receiving this medicine? Side effects that you should report to your doctor or health care professional as soon as possible: -allergic reactions like skin rash, itching or hives, swelling of the face, lips, or tongue -breathing problems -dizziness -fast or irregular heartbeat -feeling faint or lightheaded, falls -fever and chills -swelling of the hands and feet -tightness in the chest Side effects that usually do not require medical attention (report to your doctor or health care professional if they continue or are bothersome): -constipation or diarrhea -headache This list may not describe all possible side effects. Call your doctor for medical advice about side effects. You may report side effects to FDA at 1-800-FDA-1088. Where should I keep my medicine? Keep out of the reach of children. Store between 2 and 30 degrees C (36 and 86 degrees F). Throw away any unused medicine after the expiration date. NOTE: This sheet is a summary. It may not cover all possible information. If you have questions about this medicine, talk to your doctor, pharmacist, or health care provider.  2013, [employer]. (07/14/2010 11:16:24 AM)

## 2013-05-28 LAB — OB RESULTS CONSOLE HEPATITIS B SURFACE ANTIGEN: HEP B S AG: NEGATIVE

## 2013-05-28 LAB — OB RESULTS CONSOLE HIV ANTIBODY (ROUTINE TESTING): HIV: NONREACTIVE

## 2013-05-28 LAB — OB RESULTS CONSOLE RUBELLA ANTIBODY, IGM: Rubella: IMMUNE

## 2013-05-28 LAB — OB RESULTS CONSOLE ABO/RH: RH Type: POSITIVE

## 2013-05-28 LAB — OB RESULTS CONSOLE RPR: RPR: NONREACTIVE

## 2013-05-28 LAB — OB RESULTS CONSOLE ANTIBODY SCREEN: ANTIBODY SCREEN: NEGATIVE

## 2013-08-06 ENCOUNTER — Encounter: Payer: Self-pay | Admitting: Cardiology

## 2013-08-06 ENCOUNTER — Ambulatory Visit (INDEPENDENT_AMBULATORY_CARE_PROVIDER_SITE_OTHER): Payer: BC Managed Care – PPO | Admitting: Cardiology

## 2013-08-06 VITALS — BP 120/62 | HR 84 | Ht 61.0 in | Wt 121.0 lb

## 2013-08-06 DIAGNOSIS — R0789 Other chest pain: Secondary | ICD-10-CM | POA: Insufficient documentation

## 2013-08-06 DIAGNOSIS — R0602 Shortness of breath: Secondary | ICD-10-CM

## 2013-08-06 DIAGNOSIS — R079 Chest pain, unspecified: Secondary | ICD-10-CM

## 2013-08-06 NOTE — Progress Notes (Signed)
  [address] [place], [place]  [address] Phone: [phone] Fax:  [phone]  Date:  08/06/2013   ID:  Charlotte Giles, DOB June 24, 1986, MRN [id number]  PCP:  No primary provider on file.  Cardiologist:  Armanda Magic, MD   History of Present Illness: Charlotte Giles is a 27 y.o. female with no cardiac history who presents for evaluation of DOE and CP with deep breathing. The CP is very random and is nonexertional.  She describes it as a deep pain midsternal with no radiation.  She does belch a lot but that does not relieve the pain. The pain only occurs with deep breathing.   Her SOB started around her 12th week of pregnancy and she is now 21 weeks.  She has some LE edema at the end of the day but she stands on her feet all day.  She denies any palpitations, dizziness or syncope.   Wt Readings from Last 3 Encounters:  08/06/13 121 lb (54.885 kg)  01/14/13 113 lb (51.256 kg)  07/06/12 110 lb (49.896 kg)     Past Medical History  Diagnosis Date  . 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 ondansetron (ZOFRAN ODT) 4 MG disintegrating tablet Take 1 tablet (4 mg total) by mouth every 8 (eight) hours as needed for nausea.  20 tablet  0  . PRENATAL VITAMINS PO Take by mouth.       No current facility-administered medications for this visit.    Allergies:   No Known Allergies  Social History:  The patient  reports that she has never smoked. She has never used smokeless tobacco. She reports that she drinks alcohol. She reports that she does not use illicit drugs.   Family History:  The patient's family history includes Cancer (age of onset: 20) in her father; Diabetes in her maternal grandmother; Hypertension in her maternal grandmother and mother.   ROS:  Please see the history of present illness.      All other systems reviewed and negative.    PHYSICAL EXAM: VS:  BP 120/62  Pulse 84  Ht 5\' 1"  (1.549 m)  Wt 121 lb (54.885 kg)  BMI 22.87 kg/m2  LMP 03/02/2013 Well nourished, well developed, in no acute distress HEENT: normal Neck: no JVD Cardiac:  normal S1, S2; RRR; no murmur Lungs:  clear to auscultation bilaterally, no wheezing, rhonchi or rales Abd: soft, nontender, no hepatomegaly Ext: no edema Skin: warm and dry Neuro:  CNs 2-12 intact, no focal abnormalities noted  EKG:  NSR at 84bpm     ASSESSMENT AND PLAN:  1. Atypical CP - pain is very atypical and only occurs with deep breathing.   2. DOE  - 2D echo to evaluate for structural heart disease 3. 21 week pregnancy  Followup with me 1 week after echo  Signed, Armanda Magic, MD 08/06/2013 2:07 PM

## 2013-08-06 NOTE — Patient Instructions (Signed)
Your physician has requested that you have an echocardiogram. Echocardiography is a painless test that uses sound waves to create images of your heart. It provides your doctor with information about the size and shape of your heart and how well your heart's chambers and valves are working. This procedure takes approximately one hour. There are no restrictions for this procedure.  Your physician recommends that you schedule a follow-up appointment in: One week after scheduled ECHO.

## 2013-08-20 ENCOUNTER — Ambulatory Visit (HOSPITAL_COMMUNITY): Payer: BC Managed Care – PPO | Attending: Cardiology | Admitting: Radiology

## 2013-08-20 DIAGNOSIS — R0602 Shortness of breath: Secondary | ICD-10-CM

## 2013-08-20 NOTE — Progress Notes (Signed)
Echocardiogram performed.  

## 2013-08-27 ENCOUNTER — Other Ambulatory Visit: Payer: Self-pay

## 2013-09-03 ENCOUNTER — Ambulatory Visit (INDEPENDENT_AMBULATORY_CARE_PROVIDER_SITE_OTHER): Payer: BC Managed Care – PPO | Admitting: Cardiology

## 2013-09-03 ENCOUNTER — Encounter: Payer: Self-pay | Admitting: Cardiology

## 2013-09-03 VITALS — BP 102/68 | HR 85 | Ht 61.0 in | Wt 127.0 lb

## 2013-09-03 DIAGNOSIS — R0609 Other forms of dyspnea: Secondary | ICD-10-CM

## 2013-09-03 DIAGNOSIS — R0789 Other chest pain: Secondary | ICD-10-CM

## 2013-09-03 NOTE — Patient Instructions (Signed)
Your physician recommends that you continue on your current medications as directed. Please refer to the Current Medication list given to you today.  Your physician recommends that you schedule a follow-up appointment in: 2 Months with Dr Turner  

## 2013-09-03 NOTE — Progress Notes (Signed)
  [address], [address] [place], [place]  [address] Phone: [phone] Fax:  [phone]  Date:  09/03/2013   ID:  Charlotte Giles, DOB 12/26/1985, MRN [id number]  PCP:  No primary provider on file.  Cardiologist:  Armanda Magic, MD     History of Present Illness: Charlotte Giles is a 27 y.o. female with no cardiac history who presented recently for evaluation of DOE and CP with deep breathing. The CP is very random and is nonexertional. She describes it as a deep pain midsternal with no radiation. She does belch a lot but that does not relieve the pain. The pain only occurs with deep breathing. Her SOB started around her 12th week of pregnancy and she is now 26 weeks. She has some LE edema at the end of the day but she stands on her feet all day. She denies any palpitations, dizziness or syncope. She underwent 2D echo which showed  Normal LVF with trivial MR/TR.  She now presents today for followup.  She says that the CP has resolved but she still has some SOB.       Wt Readings from Last 3 Encounters:  09/03/13 127 lb (57.607 kg)  08/06/13 121 lb (54.885 kg)  01/14/13 113 lb (51.256 kg)     Past Medical History  Diagnosis Date  . 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 ondansetron (ZOFRAN ODT) 4 MG disintegrating tablet Take 1 tablet (4 mg total) by mouth every 8 (eight) hours as needed for nausea.  20 tablet  0  . PRENATAL VITAMINS PO Take by mouth.       No current facility-administered medications for this visit.    Allergies:   No Known Allergies  Social History:  The patient  reports that she has never smoked. She has never used smokeless tobacco. She reports that she drinks alcohol. She reports that she does not use illicit drugs.   Family History:  The patient's family history includes Cancer (age of onset: 6) in her father; Diabetes in her  maternal grandmother; Hypertension in her maternal grandmother and mother.   ROS:  Please see the history of present illness.      All other systems reviewed and negative.   PHYSICAL EXAM: VS:  BP 102/68  Pulse 85  Ht 5\' 1"  (1.549 m)  Wt 127 lb (57.607 kg)  BMI 24.01 kg/m2  LMP 03/02/2013 Well nourished, well developed, in no acute distress HEENT: normal Neck: no JVD Cardiac:  normal S1, S2; RRR; no murmur Lungs:  clear to auscultation bilaterally, no wheezing, rhonchi or rales Abd: soft, nontender, no hepatomegaly Ext: no edema Skin: warm and dry Neuro:  CNs 2-12 intact, no focal abnormalities noted       ASSESSMENT AND PLAN:  1. Atypical chest pain - resolved 2. SOB most likely secondary to pregnancy.  2D echo showed normal LVF with trivial MR/TR - I have reassured her that her heart is good.  I do not think her SOB is due to cardiac etiology.    Followup with me in 2 months  Signed, Armanda Magic, MD 09/03/2013 9:40 AM

## 2013-10-22 NOTE — L&D Delivery Note (Signed)
Delivery Note At 12:53 AM a viable and healthy female was delivered via Vaginal, Spontaneous Delivery (Presentation: Right Occiput Anterior).  APGAR: 8, 9; weight . pending  Placenta status: Intact, Spontaneous Pathology.  Sent to path for maternal fever Cord: 3 vessels with the following complications:  Cord draped next to chest & True Knot.  Cord pH: none  Anesthesia: Epidural Local  Episiotomy: None Lacerations: 2nd degree;Perineal Suture Repair: 3.0 chromic Est. Blood Loss (mL): 250 Cord blood collected for public donation  Mom to postpartum.  Baby to Couplet care / Skin to Skin.  Tashica Provencio A 12/11/2013, 1:34 AM

## 2013-11-03 ENCOUNTER — Encounter: Payer: Self-pay | Admitting: Cardiology

## 2013-11-03 ENCOUNTER — Ambulatory Visit (INDEPENDENT_AMBULATORY_CARE_PROVIDER_SITE_OTHER): Payer: BC Managed Care – PPO | Admitting: Cardiology

## 2013-11-03 VITALS — BP 104/70 | HR 88 | Ht 61.0 in | Wt 142.2 lb

## 2013-11-03 DIAGNOSIS — R0609 Other forms of dyspnea: Secondary | ICD-10-CM

## 2013-11-03 DIAGNOSIS — R0989 Other specified symptoms and signs involving the circulatory and respiratory systems: Secondary | ICD-10-CM

## 2013-11-03 DIAGNOSIS — R0789 Other chest pain: Secondary | ICD-10-CM

## 2013-11-03 NOTE — Progress Notes (Signed)
  [address] [place], [place]  [id number] Phone: [phone] Fax:  [phone]  Date:  11/03/2013   ID:  Charlotte Beamrekia L Keathley, DOB 02/22/1986, MRN [id number]  PCP:  No primary provider on file.  Cardiologist:  Armanda Magicraci Turner, MD     History of Present Illness: Charlotte Giles is a 28 y.o. female with no cardiac history who presents for followup of DOE and CP with deep breathing. The pain only occured with deep breathing. Her SOB started around her 12th week of pregnancy and she is now 35 weeks. She has some LE edema at the end of the day but she stands on her feet all day. She denies any palpitations, dizziness or syncope.  A 2D echo was done which showed normal LVF.  She presents back today for followup.  The chest pain has completely resolved. She still has DOE which is stable and has not changed.      Wt Readings from Last 3 Encounters:  11/03/13 142 lb 3.2 oz (64.501 kg)  09/03/13 127 lb (57.607 kg)  08/06/13 121 lb (54.885 kg)     Past Medical History  Diagnosis Date  . 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 ondansetron (ZOFRAN ODT) 4 MG disintegrating tablet Take 1 tablet (4 mg total) by mouth every 8 (eight) hours as needed for nausea.  20 tablet  0  . PRENATAL VITAMINS PO Take by mouth.       No current facility-administered medications for this visit.    Allergies:   No Known Allergies  Social History:  The patient  reports that she has never smoked. She has never used smokeless tobacco. She reports that she drinks alcohol. She reports that she does not use illicit drugs.   Family History:  The patient's family history includes Cancer (age of onset: 3461) in her father; Diabetes in her maternal grandmother; Hypertension in her maternal grandmother and mother.   ROS:  Please see the history of present illness.      All other systems reviewed  and negative.   PHYSICAL EXAM: VS:  BP 104/70  Pulse 88  Ht 5\' 1"  (1.549 m)  Wt 142 lb 3.2 oz (64.501 kg)  BMI 26.88 kg/m2  LMP 03/02/2013 Well nourished, well developed, in no acute distress HEENT: normal Neck: no JVD Cardiac:  normal S1, S2; RRR; no murmur Lungs:  clear to auscultation bilaterally, no wheezing, rhonchi or rales Abd: soft, nontender, no hepatomegaly Ext: no edema Skin: warm and dry Neuro:  CNs 2-12 intact, no focal abnormalities noted       ASSESSMENT AND PLAN:  1. DOE most likely secondary to pregnancy.  2D echo showed normal LVF Atypical pleuritic CP - resolved  Followup with me in 2 months  Signed, Armanda Magicraci Turner, MD 11/03/2013 9:23 AM

## 2013-11-03 NOTE — Patient Instructions (Signed)
Your physician recommends that you continue on your current medications as directed. Please refer to the Current Medication list given to you today.  Your physician recommends that you schedule a follow-up appointment in: 2 Months with Dr Mayford Knifeurner

## 2013-11-11 LAB — OB RESULTS CONSOLE GBS: STREP GROUP B AG: NEGATIVE

## 2013-12-08 ENCOUNTER — Telehealth (HOSPITAL_COMMUNITY): Payer: Self-pay | Admitting: *Deleted

## 2013-12-08 ENCOUNTER — Encounter (HOSPITAL_COMMUNITY): Payer: Self-pay | Admitting: *Deleted

## 2013-12-08 NOTE — Telephone Encounter (Signed)
Preadmission screen  

## 2013-12-09 ENCOUNTER — Other Ambulatory Visit: Payer: Self-pay | Admitting: Obstetrics and Gynecology

## 2013-12-10 ENCOUNTER — Encounter (HOSPITAL_COMMUNITY): Payer: BC Managed Care – PPO | Admitting: Anesthesiology

## 2013-12-10 ENCOUNTER — Encounter (HOSPITAL_COMMUNITY): Payer: Self-pay | Admitting: *Deleted

## 2013-12-10 ENCOUNTER — Inpatient Hospital Stay (HOSPITAL_COMMUNITY)
Admission: AD | Admit: 2013-12-10 | Discharge: 2013-12-12 | DRG: 774 | Disposition: A | Discharge status: Home / Self Care | Payer: BC Managed Care – PPO | Source: Ambulatory Visit | Attending: Obstetrics and Gynecology | Admitting: Obstetrics and Gynecology

## 2013-12-10 ENCOUNTER — Inpatient Hospital Stay (HOSPITAL_COMMUNITY): Payer: BC Managed Care – PPO | Admitting: Anesthesiology

## 2013-12-10 DIAGNOSIS — O480 Post-term pregnancy: Principal | ICD-10-CM | POA: Diagnosis present

## 2013-12-10 DIAGNOSIS — D62 Acute posthemorrhagic anemia: Secondary | ICD-10-CM | POA: Diagnosis not present

## 2013-12-10 DIAGNOSIS — O9903 Anemia complicating the puerperium: Secondary | ICD-10-CM | POA: Diagnosis not present

## 2013-12-10 HISTORY — DX: Acute posthemorrhagic anemia: D62

## 2013-12-10 HISTORY — DX: Anemia, unspecified: D64.9

## 2013-12-10 LAB — CBC
HEMATOCRIT: 36.1 % (ref 36.0–46.0)
HEMOGLOBIN: 12.5 g/dL (ref 12.0–15.0)
MCH: 30.7 pg (ref 26.0–34.0)
MCHC: 34.6 g/dL (ref 30.0–36.0)
MCV: 88.7 fL (ref 78.0–100.0)
Platelets: 225 10*3/uL (ref 150–400)
RBC: 4.07 MIL/uL (ref 3.87–5.11)
RDW: 13.8 % (ref 11.5–15.5)
WBC: 13.1 10*3/uL — AB (ref 4.0–10.5)

## 2013-12-10 LAB — RPR: RPR Ser Ql: NONREACTIVE

## 2013-12-10 MED ORDER — BUTORPHANOL TARTRATE 1 MG/ML IJ SOLN
1.0000 mg | INTRAMUSCULAR | Status: DC | PRN
  Administered 2013-12-10: 1 mg via INTRAVENOUS
  Filled 2013-12-10 (×2): qty 1

## 2013-12-10 MED ORDER — OXYTOCIN 40 UNITS IN LACTATED RINGERS INFUSION - SIMPLE MED: 62.5000 mL/h | INTRAVENOUS | Status: DC

## 2013-12-10 MED ORDER — TERBUTALINE SULFATE 1 MG/ML IJ SOLN: 0.2500 mg | Freq: Once | INTRAMUSCULAR | Status: AC | PRN

## 2013-12-10 MED ORDER — LACTATED RINGERS IV SOLN
INTRAVENOUS | Status: DC
  Administered 2013-12-10 (×3): via INTRAVENOUS

## 2013-12-10 MED ORDER — FENTANYL 2.5 MCG/ML BUPIVACAINE 1/10 % EPIDURAL INFUSION (WH - ANES)
INTRAMUSCULAR | Status: AC
  Filled 2013-12-10: qty 125

## 2013-12-10 MED ORDER — ACETAMINOPHEN 500 MG PO TABS
1000.0000 mg | ORAL_TABLET | Freq: Four times a day (QID) | ORAL | Status: DC | PRN
  Administered 2013-12-10: 1000 mg via ORAL
  Filled 2013-12-10: qty 2

## 2013-12-10 MED ORDER — PHENYLEPHRINE 40 MCG/ML (10ML) SYRINGE FOR IV PUSH (FOR BLOOD PRESSURE SUPPORT)
80.0000 ug | PREFILLED_SYRINGE | INTRAVENOUS | Status: DC | PRN
  Filled 2013-12-10: qty 2

## 2013-12-10 MED ORDER — LIDOCAINE HCL (PF) 1 % IJ SOLN
30.0000 mL | INTRAMUSCULAR | Status: DC | PRN
  Administered 2013-12-11: 30 mL via SUBCUTANEOUS
  Filled 2013-12-10: qty 30

## 2013-12-10 MED ORDER — OXYTOCIN BOLUS FROM INFUSION
500.0000 mL | INTRAVENOUS | Status: DC
  Administered 2013-12-11: 500 mL via INTRAVENOUS

## 2013-12-10 MED ORDER — PHENYLEPHRINE 40 MCG/ML (10ML) SYRINGE FOR IV PUSH (FOR BLOOD PRESSURE SUPPORT)
PREFILLED_SYRINGE | INTRAVENOUS | Status: AC
  Filled 2013-12-10: qty 10

## 2013-12-10 MED ORDER — DIPHENHYDRAMINE HCL 50 MG/ML IJ SOLN: 12.5000 mg | INTRAMUSCULAR | Status: DC | PRN

## 2013-12-10 MED ORDER — EPHEDRINE 5 MG/ML INJ
INTRAVENOUS | Status: AC
  Filled 2013-12-10: qty 4

## 2013-12-10 MED ORDER — FENTANYL 2.5 MCG/ML BUPIVACAINE 1/10 % EPIDURAL INFUSION (WH - ANES)
INTRAMUSCULAR | Status: DC | PRN
  Administered 2013-12-10: 12 mL/h via EPIDURAL

## 2013-12-10 MED ORDER — FENTANYL 2.5 MCG/ML BUPIVACAINE 1/10 % EPIDURAL INFUSION (WH - ANES): 14.0000 mL/h | INTRAMUSCULAR | Status: DC | PRN

## 2013-12-10 MED ORDER — MISOPROSTOL 25 MCG QUARTER TABLET: 25.0000 ug | ORAL_TABLET | ORAL | Status: DC | PRN

## 2013-12-10 MED ORDER — ACETAMINOPHEN 325 MG PO TABS: 650.0000 mg | ORAL_TABLET | ORAL | Status: DC | PRN

## 2013-12-10 MED ORDER — OXYTOCIN 40 UNITS IN LACTATED RINGERS INFUSION - SIMPLE MED
1.0000 m[IU]/min | INTRAVENOUS | Status: DC
  Administered 2013-12-10: 2 m[IU]/min via INTRAVENOUS
  Filled 2013-12-10: qty 1000

## 2013-12-10 MED ORDER — CITRIC ACID-SODIUM CITRATE 334-500 MG/5ML PO SOLN: 30.0000 mL | ORAL | Status: DC | PRN

## 2013-12-10 MED ORDER — EPHEDRINE 5 MG/ML INJ
10.0000 mg | INTRAVENOUS | Status: DC | PRN
Start: 2013-12-10 — End: 2013-12-11
  Filled 2013-12-10: qty 2

## 2013-12-10 MED ORDER — IBUPROFEN 600 MG PO TABS
600.0000 mg | ORAL_TABLET | Freq: Four times a day (QID) | ORAL | Status: DC | PRN
Start: 2013-12-10 — End: 2013-12-11

## 2013-12-10 MED ORDER — ZOLPIDEM TARTRATE 5 MG PO TABS: 5.0000 mg | ORAL_TABLET | Freq: Every evening | ORAL | Status: DC | PRN

## 2013-12-10 MED ORDER — LACTATED RINGERS IV SOLN
500.0000 mL | Freq: Once | INTRAVENOUS | Status: AC
  Administered 2013-12-10: 500 mL via INTRAVENOUS

## 2013-12-10 MED ORDER — FLEET ENEMA 7-19 GM/118ML RE ENEM: 1.0000 | ENEMA | RECTAL | Status: DC | PRN

## 2013-12-10 MED ORDER — OXYCODONE-ACETAMINOPHEN 5-325 MG PO TABS: 1.0000 | ORAL_TABLET | ORAL | Status: DC | PRN

## 2013-12-10 MED ORDER — ONDANSETRON HCL 4 MG/2ML IJ SOLN: 4.0000 mg | Freq: Four times a day (QID) | INTRAMUSCULAR | Status: DC | PRN

## 2013-12-10 MED ORDER — EPHEDRINE 5 MG/ML INJ
10.0000 mg | INTRAVENOUS | Status: DC | PRN
  Filled 2013-12-10: qty 2

## 2013-12-10 MED ORDER — LIDOCAINE HCL (PF) 1 % IJ SOLN
INTRAMUSCULAR | Status: DC | PRN
  Administered 2013-12-10 (×2): 4 mL

## 2013-12-10 MED ORDER — LACTATED RINGERS IV SOLN
500.0000 mL | INTRAVENOUS | Status: DC | PRN
  Administered 2013-12-10: 1000 mL via INTRAVENOUS

## 2013-12-10 NOTE — Progress Notes (Signed)
Charlotte Giles is a 28 y.o. G1P0 at 6162w3d by LMP admitted for active labor  Subjective: No chief complaint on file.  Notes uncomfortable Epidural Pitocin 20 miu  Objective: BP 125/71  Pulse 108  Temp(Src) 99.6 F (37.6 C) (Axillary)  Resp 20  Ht 5\' 1"  (1.549 m)  Wt 68.493 kg (151 lb)  BMI 28.55 kg/m2  SpO2 98%  LMP 03/02/2013      FHT:  FHR: 145-150 bpm, variability: moderate,  accelerations:  Present,  decelerations:  Absent UC:   regular, every 2 minutes SVE:   10 cm dilated, 100% effaced, +1/2 station   Labs: Lab Results  Component Value Date   WBC 13.1* 12/10/2013   HGB 12.5 12/10/2013   HCT 36.1 12/10/2013   MCV 88.7 12/10/2013   PLT 225 12/10/2013    Assessment / Plan: Augmentation of labor, progressing well Postdates Intrapartum fever suspect related to dehydration P) IVF bolus given. Tylenol 1 gram given imminent delivery. Decrease pitocin. Start pushing  Anticipated MOD:  NSVD  Charlotte Giles A 12/10/2013, 11:46 PM

## 2013-12-10 NOTE — Anesthesia Preprocedure Evaluation (Signed)
Anesthesia Evaluation  Patient identified by MRN, date of birth, ID band Patient awake    Reviewed: Allergy & Precautions, H&P , Patient's Chart, lab work & pertinent test results  Airway Mallampati: III TM Distance: >3 FB Neck ROM: Full    Dental no notable dental hx. (+) Teeth Intact   Pulmonary shortness of breath,  breath sounds clear to auscultation  Pulmonary exam normal       Cardiovascular negative cardio ROS  Rhythm:Regular Rate:Normal     Neuro/Psych negative neurological ROS  negative psych ROS   GI/Hepatic negative GI ROS, Neg liver ROS,   Endo/Other  negative endocrine ROS  Renal/GU negative Renal ROS  negative genitourinary   Musculoskeletal negative musculoskeletal ROS (+)   Abdominal   Peds  Hematology  (+) anemia ,   Anesthesia Other Findings   Reproductive/Obstetrics (+) Pregnancy HSV HPV                           Anesthesia Physical Anesthesia Plan  ASA: II  Anesthesia Plan: Epidural   Post-op Pain Management:    Induction:   Airway Management Planned: Natural Airway  Additional Equipment:   Intra-op Plan:   Post-operative Plan:   Informed Consent: I have reviewed the patients History and Physical, chart, labs and discussed the procedure including the risks, benefits and alternatives for the proposed anesthesia with the patient or authorized representative who has indicated his/her understanding and acceptance.     Plan Discussed with: Anesthesiologist  Anesthesia Plan Comments:         Anesthesia Quick Evaluation

## 2013-12-10 NOTE — Anesthesia Procedure Notes (Signed)
Epidural Patient location during procedure: OB Start time: 12/10/2013 6:47 PM  Staffing Anesthesiologist: Sidi Dzikowski A. Performed by: anesthesiologist   Preanesthetic Checklist Completed: patient identified, site marked, surgical consent, pre-op evaluation, timeout performed, IV checked, risks and benefits discussed and monitors and equipment checked  Epidural Patient position: sitting Prep: site prepped and draped and DuraPrep Patient monitoring: continuous pulse ox and blood pressure Approach: midline Location: L3-L4 Injection technique: LOR air  Needle:  Needle type: Tuohy  Needle gauge: 17 G Needle length: 9 cm and 9 Needle insertion depth: 5 cm cm Catheter type: closed end flexible Catheter size: 19 Gauge Catheter at skin depth: 10 cm Test dose: negative and Other  Assessment Events: blood not aspirated, injection not painful, no injection resistance, negative IV test and no paresthesia  Additional Notes Patient identified. Risks and benefits discussed including failed block, incomplete  Pain control, post dural puncture headache, nerve damage, paralysis, blood pressure Changes, nausea, vomiting, reactions to medications-both toxic and allergic and post Partum back pain. All questions were answered. Patient expressed understanding and wished to proceed. Sterile technique was used throughout procedure. Epidural site was Dressed with sterile barrier dressing. No paresthesias, signs of intravascular injection Or signs of intrathecal spread were encountered.  Patient was more comfortable after the epidural was dosed. Please see RN's note for documentation of vital signs and FHR which are stable.

## 2013-12-10 NOTE — H&P (Signed)
Charlotte Giles is a 28 y.o. female presenting for admission due to labor @ 40 3/7 weeks. (-)GBS  Intact membrane History OB History   Grav Para Term Preterm Abortions TAB SAB Ect Mult Living   1              Past Medical History  Diagnosis Date  . 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 Vaginal Pap smear, abnormal   . Anemia    Past Surgical History  Procedure Laterality Date  . No past surgeries     Family History: family history includes Cancer (age of onset: 7061) in her father; Diabetes in her maternal grandmother; Hypertension in her maternal grandmother and mother. Social History:  reports that she has never smoked. She has never used smokeless tobacco. She reports that she drinks alcohol. She reports that she does not use illicit drugs.   Prenatal Transfer Tool  Maternal Diabetes: No Genetic Screening: Normal Maternal Ultrasounds/Referrals: Normal Fetal Ultrasounds or other Referrals:  None Maternal Substance Abuse:  No Significant Maternal Medications:  None Significant Maternal Lab Results:  Lab values include: Group B Strep negative Other Comments:  None  ROS  Dilation: 3.5 Effacement (%): 70 Station: -2 Exam by:: L. Cresenzo, RN Blood pressure 137/85, pulse 80, temperature 98.4 F (36.9 C), temperature source Oral, resp. rate 20, height 5\' 1"  (1.549 m), weight 68.493 kg (151 lb), last menstrual period 03/02/2013. Exam Physical Exam  Constitutional: She is oriented to person, place, and time. She appears well-developed and well-nourished.  HENT:  Head: Atraumatic.  Neck: Neck supple.  Cardiovascular: Regular rhythm.   Respiratory: Breath sounds normal.  GI: Soft.  Musculoskeletal: She exhibits edema.  Neurological: She is alert and oriented to person, place, and time.  Skin: Skin is warm and dry.  Psychiatric: She has a normal mood and affect.    Prenatal labs: ABO, Rh: --/Positive/-- (08/07  0000) Antibody: Negative (08/07 0000) Rubella: Immune (08/07 0000) RPR: Nonreactive (08/07 0000)  HBsAg: Negative (08/07 0000)  HIV: Non-reactive (08/07 0000)  GBS: Negative (01/21 0000)   Assessment/Plan: Early Labor Post dates P) admit  Routine labs. Epidural prn. Amniotomy prn. Pitocin     Gage Weant A 12/10/2013, 1:52 PM

## 2013-12-10 NOTE — Progress Notes (Signed)
S: comfortable Epidural  O:  Pitocin  VE unchanged. FSE & IUPC placed  Tracing: baseline 145-150 Ctx 2-3 mins with some couplets  IMP: arrest of dilation due to suboptimal ctx strength Postdates P) cont with pitocin.  Cont with exaggerated sims position

## 2013-12-10 NOTE — Progress Notes (Signed)
Charlotte Giles is a 28 y.o. G1P0 at 5598w3d by LMP admitted for active labor  Subjective: No chief complaint on file.  Notes painful ctx Objective: BP 137/85  Pulse 80  Temp(Src) 98.4 F (36.9 C) (Oral)  Resp 20  Ht 5\' 1"  (1.549 m)  Wt 68.493 kg (151 lb)  BMI 28.55 kg/m2  LMP 03/02/2013      FHT:  FHR: 140 bpm, variability: moderate,  accelerations:  Present,  decelerations:  Absent UC:   irregular, every q 1-4 minutes SVE:   4 cm dilated, 70% effaced, -2 station Arom clear fluid Tracing: cat 1  Labs: Lab Results  Component Value Date   WBC 13.1* 12/10/2013   HGB 12.5 12/10/2013   HCT 36.1 12/10/2013   MCV 88.7 12/10/2013   PLT 225 12/10/2013    Assessment / Plan: Spontaneous labor, progressing normally Postdates P) Epidural. Cont pitocin. Exaggerated right sims position  Anticipated MOD:  NSVD  Chavonne Sforza A 12/10/2013, 1:57 PM

## 2013-12-11 ENCOUNTER — Encounter (HOSPITAL_COMMUNITY): Payer: Self-pay | Admitting: Obstetrics and Gynecology

## 2013-12-11 MED ORDER — WITCH HAZEL-GLYCERIN EX PADS: 1.0000 "application " | MEDICATED_PAD | CUTANEOUS | Status: DC | PRN

## 2013-12-11 MED ORDER — ONDANSETRON HCL 4 MG/2ML IJ SOLN: 4.0000 mg | INTRAMUSCULAR | Status: DC | PRN

## 2013-12-11 MED ORDER — PRENATAL MULTIVITAMIN CH
1.0000 | ORAL_TABLET | Freq: Every day | ORAL | Status: DC
  Administered 2013-12-11 – 2013-12-12 (×2): 1 via ORAL
  Filled 2013-12-11 (×2): qty 1

## 2013-12-11 MED ORDER — SENNOSIDES-DOCUSATE SODIUM 8.6-50 MG PO TABS
2.0000 | ORAL_TABLET | ORAL | Status: DC
  Administered 2013-12-12: 2 via ORAL
  Filled 2013-12-11: qty 2

## 2013-12-11 MED ORDER — LANOLIN HYDROUS EX OINT: TOPICAL_OINTMENT | CUTANEOUS | Status: DC | PRN

## 2013-12-11 MED ORDER — BENZOCAINE-MENTHOL 20-0.5 % EX AERO
1.0000 "application " | INHALATION_SPRAY | CUTANEOUS | Status: DC | PRN
  Administered 2013-12-11: 1 via TOPICAL
  Filled 2013-12-11: qty 56

## 2013-12-11 MED ORDER — OXYCODONE-ACETAMINOPHEN 5-325 MG PO TABS
1.0000 | ORAL_TABLET | ORAL | Status: DC | PRN
Start: 2013-12-11 — End: 2013-12-12

## 2013-12-11 MED ORDER — ZOLPIDEM TARTRATE 5 MG PO TABS: 5.0000 mg | ORAL_TABLET | Freq: Every evening | ORAL | Status: DC | PRN

## 2013-12-11 MED ORDER — SIMETHICONE 80 MG PO CHEW: 80.0000 mg | CHEWABLE_TABLET | ORAL | Status: DC | PRN

## 2013-12-11 MED ORDER — IBUPROFEN 600 MG PO TABS
600.0000 mg | ORAL_TABLET | Freq: Four times a day (QID) | ORAL | Status: DC
  Administered 2013-12-11 – 2013-12-12 (×6): 600 mg via ORAL
  Filled 2013-12-11 (×6): qty 1

## 2013-12-11 MED ORDER — DIPHENHYDRAMINE HCL 25 MG PO CAPS: 25.0000 mg | ORAL_CAPSULE | Freq: Four times a day (QID) | ORAL | Status: DC | PRN

## 2013-12-11 MED ORDER — ONDANSETRON HCL 4 MG PO TABS: 4.0000 mg | ORAL_TABLET | ORAL | Status: DC | PRN

## 2013-12-11 MED ORDER — FERROUS SULFATE 325 (65 FE) MG PO TABS
325.0000 mg | ORAL_TABLET | Freq: Two times a day (BID) | ORAL | Status: DC
  Administered 2013-12-11 – 2013-12-12 (×3): 325 mg via ORAL
  Filled 2013-12-11 (×3): qty 1

## 2013-12-11 MED ORDER — DIBUCAINE 1 % RE OINT: 1.0000 "application " | TOPICAL_OINTMENT | RECTAL | Status: DC | PRN

## 2013-12-11 NOTE — Progress Notes (Addendum)
Received pt. From L& D RN asked if this RN wanted report, I stated yes; L/D RN left patient's bedside without giving report.

## 2013-12-11 NOTE — Anesthesia Postprocedure Evaluation (Signed)
Anesthesia Post Note  Patient: Charlotte Giles  Procedure(s) Performed: * No procedures listed *  Anesthesia type: Epidural  Patient location: Mother/Baby  Post pain: Pain level controlled  Post assessment: Post-op Vital signs reviewed  Last Vitals:  Filed Vitals:   12/11/13 1350  BP: 107/71  Pulse: 94  Temp: 37 C  Resp: 20    Post vital signs: Reviewed  Level of consciousness:alert  Complications: No apparent anesthesia complications

## 2013-12-11 NOTE — Lactation Note (Signed)
This note was copied from the chart of Charlotte Hermelinda Medicusrekia Adderley. Lactation Consultation Note Follow up consult:  [name] 2514 hours old. Mother has baby placed in football hold.  Rhythmical sucks and swallows observed >10 min. Mother states she knows how to hand express and has seen colostrum. Reviewed deep wide latch, cluster feeding, feeding 8-12 times a day and channel 11. Encouraged mother to call if assistance is needed.   Patient Name: Charlotte Giles ZOXWR'UToday's Date: 12/11/2013 Reason for consult: Follow-up assessment   Maternal Data    Feeding Feeding Type: Breast Fed  LATCH Score/Interventions Latch: Repeated attempts needed to sustain latch, nipple held in mouth throughout feeding, stimulation needed to elicit sucking reflex. Intervention(s): Skin to skin;Waking techniques Intervention(s): Adjust position;Assist with latch;Breast massage  Audible Swallowing: Spontaneous and intermittent  Type of Nipple: Everted at rest and after stimulation  Comfort (Breast/Nipple): Soft / non-tender     Hold (Positioning): Assistance needed to correctly position infant at breast and maintain latch.  LATCH Score: 8  Lactation Tools Discussed/Used     Consult Status Consult Status: Follow-up Date: 12/12/13 Follow-up type: In-patient    Dahlia ByesBerkelhammer, Robina Hamor [facility] 12/11/2013, 3:01 PM

## 2013-12-11 NOTE — Lactation Note (Signed)
This note was copied from the chart of Charlotte Hermelinda Medicusrekia Charland. Lactation Consultation Note Initial consultation with first time mom; baby almost 9 hours old. Mom states he has maybe fed one time. Mom requesting help with feeding. Baby awake and alert, not showing hunger cues. Assisted mom to position baby in football on right and cross cradle on left. Demonstrated hand expression. However, baby just licking and nuzzling, no latch.  Enc frequent STS and cue based feeding, and to call for help if needed.  Reviewed baby and me book br feeding basics. Reviewed lactation brochure, community resources, BFSG. Answered questions.   Patient Name: Charlotte Giles ZOXWR'UToday's Date: 12/11/2013 Reason for consult: Initial assessment   Maternal Data Formula Feeding for Exclusion: No Has patient been taught Hand Expression?: Yes Does the patient have breastfeeding experience prior to this delivery?: No  Feeding Feeding Type: Breast Fed Length of feed: 0 min  LATCH Score/Interventions Latch: Too sleepy or reluctant, no latch achieved, no sucking elicited.                    Lactation Tools Discussed/Used     Consult Status Consult Status: Follow-up Follow-up type: In-patient    Charlotte Giles, Charlotte Giles 12/11/2013, 10:02 AM

## 2013-12-12 ENCOUNTER — Encounter (HOSPITAL_COMMUNITY): Payer: Self-pay | Admitting: Obstetrics and Gynecology

## 2013-12-12 ENCOUNTER — Inpatient Hospital Stay (HOSPITAL_COMMUNITY): Admission: RE | Admit: 2013-12-12 | Payer: BC Managed Care – PPO | Source: Ambulatory Visit

## 2013-12-12 DIAGNOSIS — D62 Acute posthemorrhagic anemia: Secondary | ICD-10-CM

## 2013-12-12 HISTORY — DX: Acute posthemorrhagic anemia: D62

## 2013-12-12 LAB — CBC
HCT: 22.4 % — ABNORMAL LOW (ref 36.0–46.0)
HEMOGLOBIN: 7.5 g/dL — AB (ref 12.0–15.0)
MCH: 30.5 pg (ref 26.0–34.0)
MCHC: 33.9 g/dL (ref 30.0–36.0)
MCV: 90 fL (ref 78.0–100.0)
Platelets: 179 10*3/uL (ref 150–400)
RBC: 2.49 MIL/uL — ABNORMAL LOW (ref 3.87–5.11)
RDW: 14.3 % (ref 11.5–15.5)
WBC: 13.9 10*3/uL — ABNORMAL HIGH (ref 4.0–10.5)

## 2013-12-12 MED ORDER — OXYCODONE-ACETAMINOPHEN 5-325 MG PO TABS: 1.0000 | ORAL_TABLET | ORAL | Status: DC | PRN

## 2013-12-12 MED ORDER — IBUPROFEN 600 MG PO TABS: 600.0000 mg | ORAL_TABLET | Freq: Four times a day (QID) | ORAL | Status: DC

## 2013-12-12 NOTE — Discharge Instructions (Signed)
Call if temperature greater than equal to 100.4, nothing per vagina for 4-6 weeks , showers no bath, soaking a regular pad every hours or more frequently

## 2013-12-12 NOTE — Discharge Summary (Signed)
Obstetric Discharge Summary Reason for Admission: onset of labor Prenatal Procedures: ultrasound Intrapartum Procedures: spontaneous vaginal delivery Postpartum Procedures: none Complications-Operative and Postpartum: 2nd degree perineal laceration Hemoglobin  Date Value Ref Range Status  12/12/2013 7.5* 12.0 - 15.0 g/dL Final     REPEATED TO VERIFY     DELTA CHECK NOTED     HCT  Date Value Ref Range Status  12/12/2013 22.4* 36.0 - 46.0 % Final    Physical Exam:  General: alert, cooperative and no distress Lochia: appropriate Uterine Fundus: firm Incision: perineum well approximated DVT Evaluation: No cords or calf tenderness.  Discharge Diagnoses: Post-date pregnancy Anemia  Discharge Information: Date: 12/12/2013 Activity: pelvic rest Diet: routine Medications: PNV, Ibuprofen and Percocet Condition: stable Instructions: refer to practice specific booklet Discharge to: home Follow-up Information   Follow up with Bruna Dills A, MD In 6 weeks.   Specialty:  Obstetrics and Gynecology   Contact information:   [address] [place] [address] [phone]       Newborn Data: Live born female  Birth Weight: 7 lb 8.6 oz (3419 g) APGAR: 8, 9  Home with mother.  Coby Shrewsberry A 12/12/2013, 9:39 AM

## 2013-12-12 NOTE — Progress Notes (Addendum)
Patient ID: Charlotte Giles, female   DOB: 03/31/1986, 28 y.o.   MRN: [id number] PPD # 1 SVD  S:  Reports feeling sore, but well             Tolerating po/ No nausea or vomiting  Sore nipples - using comfort gels bilaterally             Bleeding is light             Pain controlled with ibuprofen (OTC)             Up ad lib / ambulatory / voiding without difficulties    Newborn  Information for the patient's newborn:  Charlotte Giles, Boy Charlotte [[id number]][[id number]]  female  breast feeding  / Circumcision planning this AM by Dr. Orbie Giles   O:  A & O x 3, in no apparent distress              VS:  Filed Vitals:   12/11/13 0700 12/11/13 1350 12/11/13 1855 12/12/13 0600  BP: 107/70 107/71 120/75 96/63  Pulse: 90 94 94 74  Temp: 98.8 F (37.1 C) 98.6 F (37 C) 98.4 F (36.9 C) 98.3 F (36.8 C)  TempSrc: Oral Oral Oral Oral  Resp: 18 20 19 18   Height:      Weight:      SpO2:        LABS:  Recent Labs  12/10/13 1130 12/12/13 0608  WBC 13.1* 13.9*  HGB 12.5 7.5*  HCT 36.1 22.4*  PLT 225 179    Blood type: A Positive/-- (08/07 0000)  Rubella: Immune (08/07 0000)   I&O: I/O last 3 completed shifts: In: -  Out: 750 [Urine:500; Blood:250]             Lungs: Clear and unlabored  Heart: regular rate and rhythm / no murmurs  Abdomen: soft, non-tender, non-distended              Fundus: firm, non-tender, U-4  Perineum: 2nd degree laceration healing well, no edema  Lochia: light  Extremities: trace edema in RT ankle, no calf pain or tenderness, no Homans    A/P: PPD # 1  28 y.o., G1P0   Principal Problem:   Postpartum care following vaginal delivery (12/11/13) Active Problems:   SVD (spontaneous vaginal delivery)   Acute blood loss anemia   Doing well - stable status  Routine post partum orders  Anticipate discharge tomorrow    Charlotte Giles, Charlotte Giles, Charlotte Giles, Charlotte Giles, Charlotte Giles 12/12/2013, 7:20 AM   Request early d/c. D/c instructions reviewed f/u 6 weeks. Script. Percocet, Motrin F/u 6 weeks. D/c booklet  given

## 2013-12-12 NOTE — Lactation Note (Signed)
This note was copied from the chart of Charlotte Giles Kastens. Lactation Consultation Note      Follow up consult with this mom and baby, now 35 hours post partum. Mom repots tender nipples. On exam, they are intact , normal appearance. I had mom who me how she latches - she uses the football hold, but I had her do skin to skin, and hold the baby much closer to her that she had been doing. She was tender for the first minutes or so, and then felt better. He was latched with lips flanged well. I reviewed hand expression with mom, and she was pleased to see her colostrum. Mom knows to cal lactation for questions/concerns  Patient Name: Charlotte Giles Capp ZOXWR'UToday's Date: 12/12/2013 Reason for consult: Follow-up assessment   Maternal Data    Feeding Feeding Type: Breast Fed Length of feed: 20 min  LATCH Score/Interventions Latch: Grasps breast easily, tongue down, lips flanged, rhythmical sucking. Intervention(s): Skin to skin;Waking techniques Intervention(s): Adjust position;Assist with latch  Audible Swallowing: A few with stimulation Intervention(s): Skin to skin;Hand expression  Type of Nipple: Everted at rest and after stimulation  Comfort (Breast/Nipple): Filling, red/small blisters or bruises, mild/mod discomfort  Problem noted: Mild/Moderate discomfort Interventions (Mild/moderate discomfort): Comfort gels  Hold (Positioning): Assistance needed to correctly position infant at breast and maintain latch. Intervention(s): Breastfeeding basics reviewed;Support Pillows;Position options;Skin to skin  LATCH Score: 7  Lactation Tools Discussed/Used     Consult Status Consult Status: Complete Follow-up type: Call as needed    Alfred LevinsLee, Reianna Batdorf Anne 12/12/2013, 12:04 PM

## 2013-12-16 ENCOUNTER — Encounter (HOSPITAL_COMMUNITY): Payer: Self-pay | Admitting: *Deleted

## 2014-01-05 ENCOUNTER — Ambulatory Visit (INDEPENDENT_AMBULATORY_CARE_PROVIDER_SITE_OTHER): Payer: BC Managed Care – PPO | Admitting: Cardiology

## 2014-01-05 ENCOUNTER — Encounter: Payer: Self-pay | Admitting: Cardiology

## 2014-01-05 VITALS — BP 126/76 | HR 78 | Ht 61.0 in | Wt 130.0 lb

## 2014-01-05 DIAGNOSIS — R0609 Other forms of dyspnea: Secondary | ICD-10-CM

## 2014-01-05 DIAGNOSIS — R0989 Other specified symptoms and signs involving the circulatory and respiratory systems: Secondary | ICD-10-CM

## 2014-01-05 NOTE — Patient Instructions (Signed)
Your physician recommends that you continue on your current medications as directed. Please refer to the Current Medication list given to you today.  Your physician recommends that you schedule a follow-up appointment as Needed

## 2014-01-05 NOTE — Progress Notes (Signed)
  [address] [place]  [address] Phone: [phone] Fax:  [phone]  Date:  01/05/2014   ID:  Charlotte Giles, DOB 08/03/1986, MRN [id number]  PCP:  Default, Provider, MD  Cardiologist:  Armanda Magicraci Turner, MD     History of Present Illness: Charlotte Giles is a 28 y.o. female with no prior cardiac history who presented recently for evaluation of DOE.  She was pregnant at the time and 2D echo showed normal LVF and it was felt that her DOE was due to pregnancy.  Her SOB has completely resolved since delivery.  She denies any chest pain, LE edema, palpitations or dizzines.   Wt Readings from Last 3 Encounters:  01/05/14 130 lb (58.968 kg)  12/10/13 151 lb (68.493 kg)  11/03/13 142 lb 3.2 oz (64.501 kg)     Past Medical History  Diagnosis Date  . 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 Vaginal Pap smear, abnormal   . Anemia   . Postpartum care following vaginal delivery (12/11/13) 12/11/2013  . Acute blood loss anemia 12/12/2013    Current Outpatient Prescriptions  Medication Sig Dispense Refill  . acetaminophen (TYLENOL) 500 MG tablet Take 1,000 mg by mouth daily as needed for headache.      . dicloxacillin (DYNAPEN) 250 MG capsule Take 250 mg by mouth every 6 (six) hours as needed.       Marland Kitchen. ibuprofen (ADVIL,MOTRIN) 600 MG tablet Take 1 tablet (600 mg total) by mouth every 6 (six) hours.  30 tablet  0  . Prenatal Vit-Fe Fumarate-FA (PRENATAL MULTIVITAMIN) TABS tablet Take 1 tablet by mouth daily at 12 noon.       No current facility-administered medications for this visit.    Allergies:   No Known Allergies  Social History:  The patient  reports that she has never smoked. She has never used smokeless tobacco. She reports that she drinks alcohol. She reports that she does not use illicit drugs.   Family History:  The patient's family history includes Cancer (age of onset: 5361) in her father; Diabetes in her  maternal grandmother; Hypertension in her maternal grandmother and mother.   ROS:  Please see the history of present illness.      All other systems reviewed and negative.   PHYSICAL EXAM: VS:  BP 126/76  Pulse 78  Ht 5\' 1"  (1.549 m)  Wt 130 lb (58.968 kg)  BMI 24.58 kg/m2  LMP 03/02/2013 Well nourished, well developed, in no acute distress HEENT: normal Neck: no JVD Cardiac:  normal S1, S2; RRR; no murmur Lungs:  clear to auscultation bilaterally, no wheezing, rhonchi or rales Abd: soft, nontender, no hepatomegaly Ext: no edema Skin: warm and dry Neuro:  CNs 2-12 intact, no focal abnormalities noted       ASSESSMENT AND PLAN:  1. DOE completely resolved after delivery.  Followup with me PRN  Signed, Armanda Magicraci Turner, MD 01/05/2014 10:36 AM

## 2014-08-23 ENCOUNTER — Encounter: Payer: Self-pay | Admitting: Cardiology

## 2015-03-24 ENCOUNTER — Encounter: Payer: Self-pay | Admitting: Gynecology

## 2015-03-24 ENCOUNTER — Ambulatory Visit (INDEPENDENT_AMBULATORY_CARE_PROVIDER_SITE_OTHER): Payer: BLUE CROSS/BLUE SHIELD | Admitting: Gynecology

## 2015-03-24 VITALS — BP 122/78

## 2015-03-24 DIAGNOSIS — B3731 Acute candidiasis of vulva and vagina: Secondary | ICD-10-CM

## 2015-03-24 DIAGNOSIS — Z113 Encounter for screening for infections with a predominantly sexual mode of transmission: Secondary | ICD-10-CM | POA: Diagnosis not present

## 2015-03-24 DIAGNOSIS — B373 Candidiasis of vulva and vagina: Secondary | ICD-10-CM | POA: Diagnosis not present

## 2015-03-24 DIAGNOSIS — N898 Other specified noninflammatory disorders of vagina: Secondary | ICD-10-CM

## 2015-03-24 LAB — WET PREP FOR TRICH, YEAST, CLUE
Clue Cells Wet Prep HPF POC: NONE SEEN
TRICH WET PREP: NONE SEEN

## 2015-03-24 MED ORDER — FLUCONAZOLE 150 MG PO TABS: ORAL_TABLET | ORAL | Status: DC

## 2015-03-24 NOTE — Addendum Note (Signed)
Addended by: Berna SpareASTILLO, Nature Vogelsang A on: 03/24/2015 04:57 PM   Modules accepted: Orders

## 2015-03-24 NOTE — Progress Notes (Signed)
    29 year old who presented to the office today complaining of several day history of vaginal vulvar pruritus a white discharge. Patient monogamous relationship. Last menstrual cycle a few weeks ago. Patient scheduled return to the office in a few weeks for her annual exam. Patient denied any dysuria or frequency or any fever chills nausea vomiting. No change in bowel movement habits.  Exam: Blood pressure 122/78 Gen. appearance: Well-developed well-nourished female in no acute distress Pelvic: Bartholin urethra Skene was within normal limits Vagina: Thick white discharge was noted Cervix: Thick white discharge was noted Bimanual exam not done Rectal exam: Not done  Wet prep: Moderate yeast  GC and Chlamydia culture pending  Assessment/plan: Patient with yeast vaginitis will be treated with defecation 150 mg one by mouth a second tablet was prescribed in the event there was no improvement within 48 hours. Patient scheduled return back to the office in 2 weeks for her annual exam.

## 2015-03-24 NOTE — Patient Instructions (Signed)
Fluconazole tablets What is this medicine? FLUCONAZOLE (floo KON na zole) is an antifungal medicine. It is used to treat certain kinds of fungal or yeast infections. This medicine may be used for other purposes; ask your health care provider or pharmacist if you have questions. COMMON BRAND NAME(S): Diflucan What should I tell my health care provider before I take this medicine? They need to know if you have any of these conditions: -electrolyte abnormalities -history of irregular heart beat -kidney disease -an unusual or allergic reaction to fluconazole, other azole antifungals, medicines, foods, dyes, or preservatives -pregnant or trying to get pregnant -breast-feeding How should I use this medicine? Take this medicine by mouth. Follow the directions on the prescription label. Do not take your medicine more often than directed. Talk to your pediatrician regarding the use of this medicine in children. Special care may be needed. This medicine has been used in children as young as 6 months of age. Overdosage: If you think you have taken too much of this medicine contact a poison control center or emergency room at once. NOTE: This medicine is only for you. Do not share this medicine with others. What if I miss a dose? If you miss a dose, take it as soon as you can. If it is almost time for your next dose, take only that dose. Do not take double or extra doses. What may interact with this medicine? Do not take this medicine with any of the following medications: -astemizole -certain medicines for irregular heart beat like dofetilide, dronedarone, quinidine -cisapride -erythromycin -lomitapide -other medicines that prolong the QT interval (cause an abnormal heart rhythm) -pimozide -terfenadine -thioridazine -tolvaptan -ziprasidone This medicine may also interact with the following medications: -antiviral medicines for HIV or AIDS -birth control pills -certain antibiotics like  rifabutin, rifampin -certain medicines for blood pressure like amlodipine, isradipine, felodipine, hydrochlorothiazide, losartan, nifedipine -certain medicines for cancer like cyclophosphamide, vinblastine, vincristine -certain medicines for cholesterol like atorvastatin, lovastatin, fluvastatin, simvastatin -certain medicines for depression, anxiety, or psychotic disturbances like amitriptyline, midazolam, nortriptyline, triazolam -certain medicines for diabetes like glipizide, glyburide, tolbutamide -certain medicines for pain like alfentanil, fentanyl, methadone -certain medicines for seizures like carbamazepine, phenytoin -certain medicines that treat or prevent blood clots like warfarin -halofantrine -medicines that lower your chance of fighting infection like cyclosporine, prednisone, tacrolimus -NSAIDS, medicines for pain and inflammation, like celecoxib, diclofenac, flurbiprofen, ibuprofen, meloxicam, naproxen -other medicines for fungal infections -sirolimus -theophylline -tofacitinib This list may not describe all possible interactions. Give your health care provider a list of all the medicines, herbs, non-prescription drugs, or dietary supplements you use. Also tell them if you smoke, drink alcohol, or use illegal drugs. Some items may interact with your medicine. What should I watch for while using this medicine? Visit your doctor or health care professional for regular checkups. If you are taking this medicine for a long time you may need blood work. Tell your doctor if your symptoms do not improve. Some fungal infections need many weeks or months of treatment to cure. Alcohol can increase possible damage to your liver. Avoid alcoholic drinks. If you have a vaginal infection, do not have sex until you have finished your treatment. You can wear a sanitary napkin. Do not use tampons. Wear freshly washed cotton, not synthetic, panties. What side effects may I notice from receiving this  medicine? Side effects that you should report to your doctor or health care professional as soon as possible: -allergic reactions like skin rash or itching, hives, swelling   of the lips, mouth, tongue, or throat -dark urine -feeling dizzy or faint -irregular heartbeat or chest pain -redness, blistering, peeling or loosening of the skin, including inside the mouth -trouble breathing -unusual bruising or bleeding -vomiting -yellowing of the eyes or skin Side effects that usually do not require medical attention (report to your doctor or health care professional if they continue or are bothersome): -changes in how food tastes -diarrhea -headache -stomach upset or nausea This list may not describe all possible side effects. Call your doctor for medical advice about side effects. You may report side effects to FDA at 1-800-FDA-1088. Where should I keep my medicine? Keep out of the reach of children. Store at room temperature below 30 degrees C (86 degrees F). Throw away any medicine after the expiration date. NOTE: This sheet is a summary. It may not cover all possible information. If you have questions about this medicine, talk to your doctor, pharmacist, or health care provider.  2015, Elsevier/Gold Standard. (2013-05-16 16:13:04) Monilial Vaginitis Vaginitis in a soreness, swelling and redness (inflammation) of the vagina and vulva. Monilial vaginitis is not a sexually transmitted infection. CAUSES  Yeast vaginitis is caused by yeast (candida) that is normally found in your vagina. With a yeast infection, the candida has overgrown in number to a point that upsets the chemical balance. SYMPTOMS   White, thick vaginal discharge.  Swelling, itching, redness and irritation of the vagina and possibly the lips of the vagina (vulva).  Burning or painful urination.  Painful intercourse. DIAGNOSIS  Things that may contribute to monilial vaginitis are:  Postmenopausal and virginal  states.  Pregnancy.  Infections.  Being tired, sick or stressed, especially if you had monilial vaginitis in the past.  Diabetes. Good control will help lower the chance.  Birth control pills.  Tight fitting garments.  Using bubble bath, feminine sprays, douches or deodorant tampons.  Taking certain medications that kill germs (antibiotics).  Sporadic recurrence can occur if you become ill. TREATMENT  Your caregiver will give you medication.  There are several kinds of anti monilial vaginal creams and suppositories specific for monilial vaginitis. For recurrent yeast infections, use a suppository or cream in the vagina 2 times a week, or as directed.  Anti-monilial or steroid cream for the itching or irritation of the vulva may also be used. Get your caregiver's permission.  Painting the vagina with methylene blue solution may help if the monilial cream does not work.  Eating yogurt may help prevent monilial vaginitis. HOME CARE INSTRUCTIONS   Finish all medication as prescribed.  Do not have sex until treatment is completed or after your caregiver tells you it is okay.  Take warm sitz baths.  Do not douche.  Do not use tampons, especially scented ones.  Wear cotton underwear.  Avoid tight pants and panty hose.  Tell your sexual partner that you have a yeast infection. They should go to their caregiver if they have symptoms such as mild rash or itching.  Your sexual partner should be treated as well if your infection is difficult to eliminate.  Practice safer sex. Use condoms.  Some vaginal medications cause latex condoms to fail. Vaginal medications that harm condoms are:  Cleocin cream.  Butoconazole (Femstat).  Terconazole (Terazol) vaginal suppository.  Miconazole (Monistat) (may be purchased over the counter). SEEK MEDICAL CARE IF:   You have a temperature by mouth above 102 F (38.9 C).  The infection is getting worse after 2 days of  treatment.  The infection   is not getting better after 3 days of treatment.  You develop blisters in or around your vagina.  You develop vaginal bleeding, and it is not your menstrual period.  You have pain when you urinate.  You develop intestinal problems.  You have pain with sexual intercourse. Document Released: 07/18/2005 Document Revised: 12/31/2011 Document Reviewed: 04/01/2009 ExitCare Patient Information 2015 [employer]. This information is not intended to replace advice given to you by your health care provider. Make sure you discuss any questions you have with your health care provider.  

## 2015-03-26 LAB — GC/CHLAMYDIA PROBE AMP
CT Probe RNA: NEGATIVE
GC Probe RNA: NEGATIVE

## 2015-04-20 ENCOUNTER — Ambulatory Visit (INDEPENDENT_AMBULATORY_CARE_PROVIDER_SITE_OTHER): Payer: BLUE CROSS/BLUE SHIELD | Admitting: Gynecology

## 2015-04-20 ENCOUNTER — Encounter: Payer: Self-pay | Admitting: Gynecology

## 2015-04-20 ENCOUNTER — Other Ambulatory Visit (HOSPITAL_COMMUNITY)
Admission: RE | Admit: 2015-04-20 | Discharge: 2015-04-20 | Disposition: A | Discharge status: Home / Self Care | Payer: BLUE CROSS/BLUE SHIELD | Source: Ambulatory Visit | Attending: Gynecology | Admitting: Gynecology

## 2015-04-20 VITALS — BP 120/78 | Ht 61.0 in | Wt 116.0 lb

## 2015-04-20 DIAGNOSIS — Z01419 Encounter for gynecological examination (general) (routine) without abnormal findings: Secondary | ICD-10-CM | POA: Insufficient documentation

## 2015-04-20 LAB — CBC WITH DIFFERENTIAL/PLATELET
Basophils Absolute: 0 10*3/uL (ref 0.0–0.1)
Basophils Relative: 0 % (ref 0–1)
EOS ABS: 0.1 10*3/uL (ref 0.0–0.7)
EOS PCT: 1 % (ref 0–5)
HCT: 37.3 % (ref 36.0–46.0)
HEMOGLOBIN: 12.5 g/dL (ref 12.0–15.0)
Lymphocytes Relative: 39 % (ref 12–46)
Lymphs Abs: 3 10*3/uL (ref 0.7–4.0)
MCH: 29.6 pg (ref 26.0–34.0)
MCHC: 33.5 g/dL (ref 30.0–36.0)
MCV: 88.2 fL (ref 78.0–100.0)
MONOS PCT: 9 % (ref 3–12)
MPV: 10.5 fL (ref 8.6–12.4)
Monocytes Absolute: 0.7 10*3/uL (ref 0.1–1.0)
Neutro Abs: 4 10*3/uL (ref 1.7–7.7)
Neutrophils Relative %: 51 % (ref 43–77)
Platelets: 260 10*3/uL (ref 150–400)
RBC: 4.23 MIL/uL (ref 3.87–5.11)
RDW: 13.6 % (ref 11.5–15.5)
WBC: 7.8 10*3/uL (ref 4.0–10.5)

## 2015-04-20 LAB — HEMOGLOBIN A1C
HEMOGLOBIN A1C: 5.8 % — AB (ref <5.7)
Mean Plasma Glucose: 120 mg/dL — ABNORMAL HIGH (ref <117)

## 2015-04-20 LAB — CHOLESTEROL, TOTAL: CHOLESTEROL: 133 mg/dL (ref 0–200)

## 2015-04-20 NOTE — Progress Notes (Signed)
Charlotte Giles [birth year] [id number]   History:    29 y.o.  for annual gyn exam with no complaints today. Patient had a normal spontaneous vaginal delivery at term in February 2015 is currently not using any form of contraception and is wishing to get pregnant again.her last Pap smear was normal in 2013. Patient denies any past history of abnormal Pap smear. In the past she completed the HPV vaccine series.  Past medical history,surgical history, family history and social history were all reviewed and documented in the EPIC chart.  Gynecologic History Patient's last menstrual period was 04/03/2015. Contraception: none Last Pap: 2013. Results were: normal Last mammogram: at indicated. Results were: at indicated  Obstetric History OB History  Gravida Para Term Preterm AB SAB TAB Ectopic Multiple Living  _0 # Outcome Date GA Lbr Len/2nd Weight Sex Delivery Anes PTL Lv  1 Term 12/11/13 21w4d08:44 / 01:09 7 lb 8.6 oz (3.419 kg) M Vag-Spont EPI,Local  Y       ROS: A ROS was performed and pertinent positives and negatives are included in the history.  GENERAL: No fevers or chills. HEENT: No change in vision, no earache, sore throat or sinus congestion. NECK: No pain or stiffness. CARDIOVASCULAR: No chest pain or pressure. No palpitations. PULMONARY: No shortness of breath, cough or wheeze. GASTROINTESTINAL: No abdominal pain, nausea, vomiting or diarrhea, melena or bright red blood per rectum. GENITOURINARY: No urinary frequency, urgency, hesitancy or dysuria. MUSCULOSKELETAL: No joint or muscle pain, no back pain, no recent trauma. DERMATOLOGIC: No rash, no itching, no lesions. ENDOCRINE: No polyuria, polydipsia, no heat or cold intolerance. No recent change in weight. HEMATOLOGICAL: No anemia or easy bruising or bleeding. NEUROLOGIC: No headache, seizures, numbness, tingling or weakness. PSYCHIATRIC: No depression, no loss of interest in normal activity or change in sleep  pattern.     Exam: chaperone present  BP 120/78 mmHg  Ht 5' 1" (1.549 m)  Wt 116 lb (52.617 kg)  BMI 21.93 kg/m2  LMP 04/03/2015  Body mass index is 21.93 kg/(m^2).  General appearance : Well developed well nourished female. No acute distress HEENT: Eyes: no retinal hemorrhage or exudates,  Neck supple, trachea midline, no carotid bruits, no thyroidmegaly Lungs: Clear to auscultation, no rhonchi or wheezes, or rib retractions  Heart: Regular rate and rhythm, no murmurs or gallops Breast:Examined in sitting and supine position were symmetrical in appearance, no palpable masses or tenderness,  no skin retraction, no nipple inversion, no nipple discharge, no skin discoloration, no axillary or supraclavicular lymphadenopathy Abdomen: no palpable masses or tenderness, no rebound or guarding Extremities: no edema or skin discoloration or tenderness  Pelvic:  Bartholin, Urethra, Skene Glands: Within normal limits             Vagina: No gross lesions or discharge  Cervix: No gross lesions or discharge  Uterus  anteverted, normal size, shape and consistency, non-tender and mobile  Adnexa  Without masses or tenderness  Anus and perineum  normal   Rectovaginal  normal sphincter tone without palpated masses or tenderness             Hemoccult not indicated     Assessment/Plan:  29y.o. female for annual exam who was instructed on the utilization of the ovulation predictor kit to time or intercourse. She was reminded of the importance of prenatal vitamins for neural tube defect prophylaxis. The following screening labs were  ordered: CBC, screening cholesterol, hemoglobin A1c, and urinalysis along with Pap smear.   Terrance Mass MD, 11:39 AM 04/20/2015

## 2015-04-21 ENCOUNTER — Other Ambulatory Visit: Payer: Self-pay | Admitting: Gynecology

## 2015-04-21 DIAGNOSIS — R739 Hyperglycemia, unspecified: Secondary | ICD-10-CM

## 2015-04-21 LAB — URINALYSIS W MICROSCOPIC + REFLEX CULTURE
BACTERIA UA: NONE SEEN
Bilirubin Urine: NEGATIVE
CASTS: NONE SEEN
Crystals: NONE SEEN
Glucose, UA: NEGATIVE mg/dL
Hgb urine dipstick: NEGATIVE
Ketones, ur: NEGATIVE mg/dL
Leukocytes, UA: NEGATIVE
NITRITE: NEGATIVE
Protein, ur: NEGATIVE mg/dL
Specific Gravity, Urine: 1.008 (ref 1.005–1.030)
Squamous Epithelial / LPF: NONE SEEN
UROBILINOGEN UA: 0.2 mg/dL (ref 0.0–1.0)
pH: 7.5 (ref 5.0–8.0)

## 2015-04-22 LAB — CYTOLOGY - PAP

## 2015-04-27 ENCOUNTER — Other Ambulatory Visit: Payer: BLUE CROSS/BLUE SHIELD

## 2015-04-27 DIAGNOSIS — R739 Hyperglycemia, unspecified: Secondary | ICD-10-CM

## 2015-04-28 LAB — GLUCOSE, FASTING: Glucose, Fasting: 84 mg/dL (ref 70–99)

## 2015-06-08 LAB — OB RESULTS CONSOLE ABO/RH: RH Type: POSITIVE

## 2015-06-08 LAB — OB RESULTS CONSOLE HEPATITIS B SURFACE ANTIGEN: Hepatitis B Surface Ag: NEGATIVE

## 2015-06-08 LAB — OB RESULTS CONSOLE RPR: RPR: NONREACTIVE

## 2015-06-08 LAB — OB RESULTS CONSOLE ANTIBODY SCREEN: ANTIBODY SCREEN: NEGATIVE

## 2015-06-08 LAB — OB RESULTS CONSOLE HIV ANTIBODY (ROUTINE TESTING): HIV: NONREACTIVE

## 2015-06-08 LAB — OB RESULTS CONSOLE RUBELLA ANTIBODY, IGM: Rubella: IMMUNE

## 2015-10-23 NOTE — L&D Delivery Note (Signed)
Delivery Note At 8:14 AM a viable and healthy female was delivered via Vaginal, Spontaneous Delivery (Presentation: Right Occiput Anterior).  APGAR: 9, 9; weight  7lb 7 oz.   Placenta status: Intact, Spontaneous. Not sent  Cord: 3 vessels with the following complications: None.  Cord pH: none  Anesthesia: Epidural  Episiotomy: None Lacerations: 1st degree perineal ; right Labial minora Suture Repair: 3.0 chromic Est. Blood Loss (mL): 424  Mom to postpartum.  Baby to Couplet care / Skin to Skin.  Lonya Johannesen A 01/14/2016, 8:47 AM

## 2015-12-12 LAB — OB RESULTS CONSOLE GBS: GBS: NEGATIVE

## 2016-01-13 ENCOUNTER — Encounter (HOSPITAL_COMMUNITY): Payer: Self-pay | Admitting: *Deleted

## 2016-01-13 ENCOUNTER — Inpatient Hospital Stay (HOSPITAL_COMMUNITY)
Admission: AD | Admit: 2016-01-13 | Discharge: 2016-01-13 | Discharge status: Absent without leave | Payer: BLUE CROSS/BLUE SHIELD | Source: Home / Self Care | Attending: Obstetrics and Gynecology | Admitting: Obstetrics and Gynecology

## 2016-01-13 ENCOUNTER — Inpatient Hospital Stay (HOSPITAL_COMMUNITY)
Admission: AD | Admit: 2016-01-13 | Discharge: 2016-01-15 | DRG: 775 | Disposition: A | Discharge status: Home / Self Care | Payer: BLUE CROSS/BLUE SHIELD | Source: Ambulatory Visit | Attending: Obstetrics and Gynecology | Admitting: Obstetrics and Gynecology

## 2016-01-13 DIAGNOSIS — O9081 Anemia of the puerperium: Secondary | ICD-10-CM | POA: Diagnosis present

## 2016-01-13 DIAGNOSIS — Z833 Family history of diabetes mellitus: Secondary | ICD-10-CM

## 2016-01-13 DIAGNOSIS — D649 Anemia, unspecified: Secondary | ICD-10-CM | POA: Diagnosis present

## 2016-01-13 DIAGNOSIS — Z87410 Personal history of cervical dysplasia: Secondary | ICD-10-CM

## 2016-01-13 DIAGNOSIS — Z8249 Family history of ischemic heart disease and other diseases of the circulatory system: Secondary | ICD-10-CM

## 2016-01-13 DIAGNOSIS — O480 Post-term pregnancy: Secondary | ICD-10-CM | POA: Diagnosis present

## 2016-01-13 DIAGNOSIS — Z3A40 40 weeks gestation of pregnancy: Secondary | ICD-10-CM | POA: Diagnosis not present

## 2016-01-13 LAB — CBC
HCT: 35.5 % — ABNORMAL LOW (ref 36.0–46.0)
Hemoglobin: 12.1 g/dL (ref 12.0–15.0)
MCH: 31.2 pg (ref 26.0–34.0)
MCHC: 34.1 g/dL (ref 30.0–36.0)
MCV: 91.5 fL (ref 78.0–100.0)
PLATELETS: 196 10*3/uL (ref 150–400)
RBC: 3.88 MIL/uL (ref 3.87–5.11)
RDW: 13.6 % (ref 11.5–15.5)
WBC: 12.6 10*3/uL — ABNORMAL HIGH (ref 4.0–10.5)

## 2016-01-13 MED ORDER — OXYTOCIN 10 UNIT/ML IJ SOLN: 10.0000 [IU] | Freq: Once | INTRAMUSCULAR | Status: DC

## 2016-01-13 MED ORDER — OXYCODONE-ACETAMINOPHEN 5-325 MG PO TABS: 2.0000 | ORAL_TABLET | ORAL | Status: DC | PRN

## 2016-01-13 MED ORDER — CITRIC ACID-SODIUM CITRATE 334-500 MG/5ML PO SOLN: 30.0000 mL | ORAL | Status: DC | PRN

## 2016-01-13 MED ORDER — SODIUM CHLORIDE 0.9 % IV SOLN: 250.0000 mL | INTRAVENOUS | Status: DC | PRN

## 2016-01-13 MED ORDER — TERBUTALINE SULFATE 1 MG/ML IJ SOLN
0.2500 mg | Freq: Once | INTRAMUSCULAR | Status: DC | PRN
  Filled 2016-01-13: qty 1

## 2016-01-13 MED ORDER — LACTATED RINGERS IV SOLN
INTRAVENOUS | Status: DC
  Administered 2016-01-13: 20:00:00 via INTRAVENOUS

## 2016-01-13 MED ORDER — ONDANSETRON HCL 4 MG/2ML IJ SOLN: 4.0000 mg | Freq: Four times a day (QID) | INTRAMUSCULAR | Status: DC | PRN

## 2016-01-13 MED ORDER — MISOPROSTOL 25 MCG QUARTER TABLET
25.0000 ug | ORAL_TABLET | ORAL | Status: DC | PRN
  Administered 2016-01-13: 25 ug via VAGINAL
  Filled 2016-01-13: qty 0.25
  Filled 2016-01-13: qty 1

## 2016-01-13 MED ORDER — ZOLPIDEM TARTRATE 5 MG PO TABS
5.0000 mg | ORAL_TABLET | Freq: Every evening | ORAL | Status: DC | PRN
Start: 2016-01-13 — End: 2016-01-14

## 2016-01-13 MED ORDER — LACTATED RINGERS IV SOLN: 500.0000 mL | INTRAVENOUS | Status: DC | PRN

## 2016-01-13 MED ORDER — SODIUM CHLORIDE 0.9% FLUSH: 3.0000 mL | Freq: Two times a day (BID) | INTRAVENOUS | Status: DC

## 2016-01-13 MED ORDER — OXYTOCIN BOLUS FROM INFUSION
500.0000 mL | INTRAVENOUS | Status: DC
  Administered 2016-01-14: 500 mL via INTRAVENOUS

## 2016-01-13 MED ORDER — LIDOCAINE HCL (PF) 1 % IJ SOLN
30.0000 mL | INTRAMUSCULAR | Status: DC | PRN
  Administered 2016-01-14: 30 mL via SUBCUTANEOUS
  Filled 2016-01-13: qty 30

## 2016-01-13 MED ORDER — OXYTOCIN 10 UNIT/ML IJ SOLN
2.5000 [IU]/h | INTRAMUSCULAR | Status: DC
  Filled 2016-01-13: qty 10

## 2016-01-13 MED ORDER — SODIUM CHLORIDE 0.9% FLUSH: 3.0000 mL | INTRAVENOUS | Status: DC | PRN

## 2016-01-13 MED ORDER — OXYCODONE-ACETAMINOPHEN 5-325 MG PO TABS: 1.0000 | ORAL_TABLET | ORAL | Status: DC | PRN

## 2016-01-13 MED ORDER — ACETAMINOPHEN 325 MG PO TABS: 650.0000 mg | ORAL_TABLET | ORAL | Status: DC | PRN

## 2016-01-13 NOTE — H&P (Signed)
Charlotte Giles is a 30 y.o. female presenting for IOL 2nd to postdates Maternal Medical History:  Contractions: Frequency: irregular.    Fetal activity: Perceived fetal activity is normal.    Prenatal complications: no prenatal complications Prenatal Complications - Diabetes: none.    OB History    Gravida Para Term Preterm AB TAB SAB Ectopic Multiple Living   2 1 1       1      Past Medical History  Diagnosis Date  . 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 Vaginal Pap smear, abnormal   . Anemia   . Postpartum care following vaginal delivery (12/11/13) 12/11/2013  . Acute blood loss anemia 12/12/2013   Past Surgical History  Procedure Laterality Date  . No past surgeries     Family History: family history includes Cancer (age of onset: 7661) in her father; Diabetes in her maternal grandmother; Hypertension in her maternal grandmother and mother. Social History:  reports that she has never smoked. She has never used smokeless tobacco. She reports that she drinks alcohol. She reports that she does not use illicit drugs.   Prenatal Transfer Tool  Maternal Diabetes: No Genetic Screening: Declined Maternal Ultrasounds/Referrals: Abnormal:  Findings:   Isolated EIF (echogenic intracardiac focus) Fetal Ultrasounds or other Referrals:  None Maternal Substance Abuse:  No Significant Maternal Medications:  None Significant Maternal Lab Results:  Lab values include: Group B Strep negative Other Comments:  None  Review of Systems  All other systems reviewed and are negative.     unknown if currently breastfeeding. Maternal Exam:  Uterine Assessment: Contraction frequency is irregular.   Abdomen: Patient reports no abdominal tenderness. Estimated fetal weight is 8lb 12 oz.   Fetal presentation: vertex  Introitus: Normal vulva. Pelvis: adequate for delivery.   Cervix: Cervix evaluated by digital exam.     Physical Exam   Constitutional: She is oriented to person, place, and time. She appears well-nourished.  HENT:  Head: Atraumatic.  Eyes: EOM are normal.  Neck: Neck supple.  Cardiovascular: Regular rhythm.   Respiratory: Effort normal.  GI: Soft.  Musculoskeletal: She exhibits no edema.  Neurological: She is alert and oriented to person, place, and time.  Skin: Skin is warm and dry.   VE 1/70/-2  Prenatal labs: ABO, Rh:  A positive Antibody:  neg Rubella:  Immune RPR:   NR HBsAg:   neg HIV:   NR GBS:   neg  Assessment/Plan:Postdates P) admit routine labs. Cytotec. Pitocin prn. Analgesic/epidural  prn  Robey Massmann A 01/13/2016, 7:51 PM

## 2016-01-14 ENCOUNTER — Inpatient Hospital Stay (HOSPITAL_COMMUNITY): Payer: BLUE CROSS/BLUE SHIELD | Admitting: Anesthesiology

## 2016-01-14 ENCOUNTER — Encounter (HOSPITAL_COMMUNITY): Payer: Self-pay | Admitting: Anesthesiology

## 2016-01-14 DIAGNOSIS — O480 Post-term pregnancy: Secondary | ICD-10-CM | POA: Diagnosis present

## 2016-01-14 LAB — CBC
HEMATOCRIT: 32.2 % — AB (ref 36.0–46.0)
Hemoglobin: 10.8 g/dL — ABNORMAL LOW (ref 12.0–15.0)
MCH: 30.7 pg (ref 26.0–34.0)
MCHC: 33.5 g/dL (ref 30.0–36.0)
MCV: 91.5 fL (ref 78.0–100.0)
PLATELETS: 159 10*3/uL (ref 150–400)
RBC: 3.52 MIL/uL — ABNORMAL LOW (ref 3.87–5.11)
RDW: 13.6 % (ref 11.5–15.5)
WBC: 13.2 10*3/uL — AB (ref 4.0–10.5)

## 2016-01-14 LAB — COMPREHENSIVE METABOLIC PANEL
ALT: 14 U/L (ref 14–54)
ANION GAP: 7 (ref 5–15)
AST: 25 U/L (ref 15–41)
Albumin: 2.7 g/dL — ABNORMAL LOW (ref 3.5–5.0)
Alkaline Phosphatase: 199 U/L — ABNORMAL HIGH (ref 38–126)
BILIRUBIN TOTAL: 0.8 mg/dL (ref 0.3–1.2)
BUN: 7 mg/dL (ref 6–20)
CHLORIDE: 104 mmol/L (ref 101–111)
CO2: 25 mmol/L (ref 22–32)
Calcium: 8.3 mg/dL — ABNORMAL LOW (ref 8.9–10.3)
Creatinine, Ser: 0.56 mg/dL (ref 0.44–1.00)
Glucose, Bld: 111 mg/dL — ABNORMAL HIGH (ref 65–99)
POTASSIUM: 3.4 mmol/L — AB (ref 3.5–5.1)
Sodium: 136 mmol/L (ref 135–145)
TOTAL PROTEIN: 5.8 g/dL — AB (ref 6.5–8.1)

## 2016-01-14 LAB — URIC ACID: URIC ACID, SERUM: 3.5 mg/dL (ref 2.3–6.6)

## 2016-01-14 LAB — RPR: RPR Ser Ql: NONREACTIVE

## 2016-01-14 LAB — GLUCOSE, CAPILLARY: Glucose-Capillary: 83 mg/dL (ref 65–99)

## 2016-01-14 MED ORDER — FERROUS SULFATE 325 (65 FE) MG PO TABS
325.0000 mg | ORAL_TABLET | Freq: Two times a day (BID) | ORAL | Status: DC
  Administered 2016-01-14 – 2016-01-15 (×2): 325 mg via ORAL
  Filled 2016-01-14 (×2): qty 1

## 2016-01-14 MED ORDER — DIPHENHYDRAMINE HCL 25 MG PO CAPS: 25.0000 mg | ORAL_CAPSULE | Freq: Four times a day (QID) | ORAL | Status: DC | PRN

## 2016-01-14 MED ORDER — EPHEDRINE 5 MG/ML INJ
10.0000 mg | INTRAVENOUS | Status: DC | PRN
  Filled 2016-01-14: qty 2

## 2016-01-14 MED ORDER — EPHEDRINE 5 MG/ML INJ
10.0000 mg | INTRAVENOUS | Status: DC | PRN
Start: 2016-01-14 — End: 2016-01-14
  Filled 2016-01-14: qty 2

## 2016-01-14 MED ORDER — ACETAMINOPHEN 325 MG PO TABS: 650.0000 mg | ORAL_TABLET | ORAL | Status: DC | PRN

## 2016-01-14 MED ORDER — IBUPROFEN 600 MG PO TABS
600.0000 mg | ORAL_TABLET | Freq: Four times a day (QID) | ORAL | Status: DC
  Administered 2016-01-14 – 2016-01-15 (×5): 600 mg via ORAL
  Filled 2016-01-14 (×5): qty 1

## 2016-01-14 MED ORDER — LANOLIN HYDROUS EX OINT: TOPICAL_OINTMENT | CUTANEOUS | Status: DC | PRN

## 2016-01-14 MED ORDER — SENNOSIDES-DOCUSATE SODIUM 8.6-50 MG PO TABS
2.0000 | ORAL_TABLET | ORAL | Status: DC
  Administered 2016-01-14: 2 via ORAL
  Filled 2016-01-14: qty 2

## 2016-01-14 MED ORDER — FENTANYL 2.5 MCG/ML BUPIVACAINE 1/10 % EPIDURAL INFUSION (WH - ANES)
14.0000 mL/h | INTRAMUSCULAR | Status: DC | PRN
  Administered 2016-01-14 (×2): 14 mL/h via EPIDURAL
  Filled 2016-01-14: qty 125

## 2016-01-14 MED ORDER — ONDANSETRON HCL 4 MG PO TABS: 4.0000 mg | ORAL_TABLET | ORAL | Status: DC | PRN

## 2016-01-14 MED ORDER — SIMETHICONE 80 MG PO CHEW: 80.0000 mg | CHEWABLE_TABLET | ORAL | Status: DC | PRN

## 2016-01-14 MED ORDER — DIPHENHYDRAMINE HCL 50 MG/ML IJ SOLN: 12.5000 mg | INTRAMUSCULAR | Status: DC | PRN

## 2016-01-14 MED ORDER — ZOLPIDEM TARTRATE 5 MG PO TABS: 5.0000 mg | ORAL_TABLET | Freq: Every evening | ORAL | Status: DC | PRN

## 2016-01-14 MED ORDER — BUTORPHANOL TARTRATE 1 MG/ML IJ SOLN
1.0000 mg | INTRAMUSCULAR | Status: DC | PRN
Start: 2016-01-14 — End: 2016-01-14
  Administered 2016-01-14: 1 mg via INTRAVENOUS
  Filled 2016-01-14 (×2): qty 1

## 2016-01-14 MED ORDER — BENZOCAINE-MENTHOL 20-0.5 % EX AERO
1.0000 "application " | INHALATION_SPRAY | CUTANEOUS | Status: DC | PRN
  Administered 2016-01-14: 1 via TOPICAL
  Filled 2016-01-14: qty 56

## 2016-01-14 MED ORDER — PHENYLEPHRINE 40 MCG/ML (10ML) SYRINGE FOR IV PUSH (FOR BLOOD PRESSURE SUPPORT)
80.0000 ug | PREFILLED_SYRINGE | INTRAVENOUS | Status: DC | PRN
  Filled 2016-01-14: qty 20
  Filled 2016-01-14: qty 2

## 2016-01-14 MED ORDER — PHENYLEPHRINE 40 MCG/ML (10ML) SYRINGE FOR IV PUSH (FOR BLOOD PRESSURE SUPPORT)
80.0000 ug | PREFILLED_SYRINGE | INTRAVENOUS | Status: DC | PRN
  Filled 2016-01-14: qty 2

## 2016-01-14 MED ORDER — LIDOCAINE HCL (PF) 1 % IJ SOLN
INTRAMUSCULAR | Status: DC | PRN
  Administered 2016-01-14: 7 mL via EPIDURAL
  Administered 2016-01-14: 6 mL via EPIDURAL

## 2016-01-14 MED ORDER — LACTATED RINGERS IV SOLN
500.0000 mL | Freq: Once | INTRAVENOUS | Status: AC
  Administered 2016-01-14: 500 mL via INTRAVENOUS

## 2016-01-14 MED ORDER — DIBUCAINE 1 % RE OINT: 1.0000 "application " | TOPICAL_OINTMENT | RECTAL | Status: DC | PRN

## 2016-01-14 MED ORDER — ONDANSETRON HCL 4 MG/2ML IJ SOLN: 4.0000 mg | INTRAMUSCULAR | Status: DC | PRN

## 2016-01-14 MED ORDER — LACTATED RINGERS IV SOLN
1.0000 m[IU]/min | INTRAVENOUS | Status: DC
  Administered 2016-01-14: 2 m[IU]/min via INTRAVENOUS

## 2016-01-14 MED ORDER — WITCH HAZEL-GLYCERIN EX PADS: 1.0000 "application " | MEDICATED_PAD | CUTANEOUS | Status: DC | PRN

## 2016-01-14 MED ORDER — PRENATAL MULTIVITAMIN CH
1.0000 | ORAL_TABLET | Freq: Every day | ORAL | Status: DC
  Administered 2016-01-14 – 2016-01-15 (×2): 1 via ORAL
  Filled 2016-01-14 (×2): qty 1

## 2016-01-14 NOTE — Anesthesia Preprocedure Evaluation (Signed)
Anesthesia Evaluation  Patient identified by MRN, date of birth, ID band Patient awake    Reviewed: Allergy & Precautions, H&P , NPO status , Patient's Chart, lab work & pertinent test results  Airway Mallampati: I  TM Distance: >3 FB Neck ROM: full    Dental no notable dental hx.    Pulmonary neg pulmonary ROS,    Pulmonary exam normal        Cardiovascular negative cardio ROS Normal cardiovascular exam     Neuro/Psych negative neurological ROS  negative psych ROS   GI/Hepatic negative GI ROS, Neg liver ROS,   Endo/Other  negative endocrine ROS  Renal/GU negative Renal ROS     Musculoskeletal   Abdominal Normal abdominal exam  (+)   Peds  Hematology   Anesthesia Other Findings   Reproductive/Obstetrics (+) Pregnancy                             Anesthesia Physical Anesthesia Plan  ASA: II  Anesthesia Plan: Epidural   Post-op Pain Management:    Induction:   Airway Management Planned:   Additional Equipment:   Intra-op Plan:   Post-operative Plan:   Informed Consent: I have reviewed the patients History and Physical, chart, labs and discussed the procedure including the risks, benefits and alternatives for the proposed anesthesia with the patient or authorized representative who has indicated his/her understanding and acceptance.     Plan Discussed with:   Anesthesia Plan Comments:         Anesthesia Quick Evaluation  

## 2016-01-14 NOTE — Lactation Note (Signed)
This note was copied from a baby's chart. Lactation Consultation Note  Patient Name: Boy Hermelinda Medicusrekia Markowicz RUEAV'WToday's Date: 01/14/2016 Reason for consult: Initial assessment Infant is 9 hours old & [facility]. Baby was born @ 5256w6d & weighed 7+7.1# at birth. Mom was working on getting [facility] latched [facility] Mom had baby on left breast in cross-cradle hold with no pillow support. Baby came off & then went back on. Suggested mom use more pillow support & to move her bed sitting up more to give her more support. Mom used her fingers to make sure he had his lips flanged & tried to switch him to cradle hold; encouraged mom to continue using cross-cradle hold until he has more head/ neck control. Mom switched back. LC & mom listened for swallows & did note some. Provided BF booklet, feeding log, & BF resources; discussed o/p lactation number & support groups. Reviewed BF pages in Baby & Me booklet; discussed BF positions, latch tips, prevention & treatment of sore nipples and engorgement, number of wet & dirty diapers, and milk storage guidelines. Mom reports she BF her first child for ~7082m but was worried about milk supply so took fenugreek, which mom reports she thinks helped. Mom reports she has a pump at home from her first & has ordered another one from her insurance as well. Mom reports she had mastitis with her first so [facility] eng[facility], encouraged mom to aim to stay on top of preventing & treating engorgement. Encouraged mom to feed 8-12 x/ 24hrs. Mom reports no further questions. Mom was still BF when LC left. Encouraged mom to ask for her nurse or LC at future BF if help is needed.  Maternal Data Does the patient have breastfeeding experience prior to this delivery?: Yes  Feeding Feeding Type: Breast Fed Length of feed: 20 min (per mom)  LATCH Score/Interventions Latch: Repeated attempts needed to sustain latch, nipple held in mouth throughout feeding,  stimulation needed to elicit sucking reflex. Intervention(s): Adjust position;Assist with latch  Audible Swallowing: A few with stimulation  Type of Nipple: Everted at rest and after stimulation  Comfort (Breast/Nipple): Soft / non-tender     Hold (Positioning): Assistance needed to correctly position infant at breast and maintain latch. Intervention(s): Support Pillows;Position options;Breastfeeding basics reviewed  LATCH Score: 7  Lactation Tools Discussed/Used     Consult Status Consult Status: Follow-up Date: 01/15/16 Follow-up type: In-patient    Oneal GroutLaura C Antia Rahal 01/14/2016, 5:53 PM

## 2016-01-14 NOTE — Progress Notes (Signed)
S: comfortable Wants the process speed up  O: epidural pitocin VE fully/bulging membrane at introitus/100%/+1 Incidental ROM clear fluid on exam  Tracing; baseline130  Ctx q  2-3 mins IMP: complete P) start pushing. Anticipate SVD

## 2016-01-14 NOTE — Anesthesia Procedure Notes (Signed)
Epidural Patient location during procedure: OB Start time: 01/14/2016 3:35 AM End time: 01/14/2016 3:39 AM  Staffing Anesthesiologist: Leilani AbleHATCHETT, Lilyanna Lunt Performed by: anesthesiologist   Preanesthetic Checklist Completed: patient identified, surgical consent, pre-op evaluation, timeout performed, IV checked, risks and benefits discussed and monitors and equipment checked  Epidural Patient position: sitting Prep: site prepped and draped and DuraPrep Patient monitoring: continuous pulse ox and blood pressure Approach: midline Location: L3-L4 Injection technique: LOR air  Needle:  Needle type: Tuohy  Needle gauge: 17 G Needle length: 9 cm and 9 Needle insertion depth: 6 cm Catheter type: closed end flexible Catheter size: 19 Gauge Catheter at skin depth: 11 cm Test dose: negative and Other  Assessment Sensory level: T9 Events: blood not aspirated, injection not painful, no injection resistance, negative IV test and no paresthesia  Additional Notes Reason for block:procedure for pain

## 2016-01-14 NOTE — Anesthesia Postprocedure Evaluation (Signed)
Anesthesia Post Note  Patient: Charlotte Giles  Procedure(s) Performed: * No procedures listed *  Patient location during evaluation: Mother Baby Anesthesia Type: Epidural Level of consciousness: awake, awake and alert, oriented and patient cooperative Pain management: pain level controlled Vital Signs Assessment: post-procedure vital signs reviewed and stable Respiratory status: spontaneous breathing, nonlabored ventilation and respiratory function stable Cardiovascular status: stable Postop Assessment: no headache, no backache, patient able to bend at knees and no signs of nausea or vomiting Anesthetic complications: no    Last Vitals:  Filed Vitals:   01/14/16 1025 01/14/16 1140  BP: 101/71 95/74  Pulse: 65 66  Temp: 36.6 C 36.8 C  Resp: 18 18    Last Pain:  Filed Vitals:   01/14/16 1216  PainSc: 2                  Andilyn Bettcher L

## 2016-01-15 LAB — CBC
HEMATOCRIT: 31 % — AB (ref 36.0–46.0)
HEMOGLOBIN: 10.6 g/dL — AB (ref 12.0–15.0)
MCH: 31 pg (ref 26.0–34.0)
MCHC: 34.2 g/dL (ref 30.0–36.0)
MCV: 90.6 fL (ref 78.0–100.0)
Platelets: 169 10*3/uL (ref 150–400)
RBC: 3.42 MIL/uL — ABNORMAL LOW (ref 3.87–5.11)
RDW: 13.6 % (ref 11.5–15.5)
WBC: 13.9 10*3/uL — ABNORMAL HIGH (ref 4.0–10.5)

## 2016-01-15 MED ORDER — IBUPROFEN 600 MG PO TABS: 600.0000 mg | ORAL_TABLET | Freq: Four times a day (QID) | ORAL | Status: DC

## 2016-01-15 NOTE — Lactation Note (Signed)
This note was copied from a baby's chart. Lactation Consultation Note; Baby just back from circ and is awake and fussing some. Assisted mom with football hold- suggested changing positions throughout the day to help nipples to heal. Baby latched well nursed for about 5 min and then off to sleep. Reviewed normal behavior after circ. Mom reports her breasts are feeling a little heavier today. No questions at present. To call prn  Patient Name: Charlotte Giles JWJXB'JToday's Date: 01/15/2016 Reason for consult: Follow-up assessment   Maternal Data Formula Feeding for Exclusion: No Has patient been taught Hand Expression?: Yes Does the patient have breastfeeding experience prior to this delivery?: Yes  Feeding Feeding Type: Breast Fed Length of feed: 5 min  LATCH Score/Interventions Latch: Grasps breast easily, tongue down, lips flanged, rhythmical sucking.  Audible Swallowing: A few with stimulation  Type of Nipple: Everted at rest and after stimulation  Comfort (Breast/Nipple): Soft / non-tender     Hold (Positioning): Assistance needed to correctly position infant at breast and maintain latch. Intervention(s): Breastfeeding basics reviewed;Position options;Skin to skin  LATCH Score: 8  Lactation Tools Discussed/Used     Consult Status Consult Status: Complete    Charlotte Giles, Charlotte Giles 01/15/2016, 10:11 AM

## 2016-01-15 NOTE — Lactation Note (Signed)
This note was copied from a baby's chart. Lactation Consultation Note; Experienced BF mom reports baby is nursing well. Reports nipples are tender- they look intact. Encouraged to rub EBM into nipples after nursing. Baby in nursery for circ at present. Mom gave formula a couple of times through the night because he was feeding so much and still acting hungry after nursing. Reviewed OP appointments and BFSG as resources for support after DC No questions at present. To call prn  Patient Name: Charlotte Giles UJWJX'BToday's Date: 01/15/2016 Reason for consult: Follow-up assessment   Maternal Data Formula Feeding for Exclusion: No Has patient been taught Hand Expression?: Yes Does the patient have breastfeeding experience prior to this delivery?: Yes  Feeding Feeding Type: Breast Fed Length of feed: 30 min  LATCH Score/Interventions                      Lactation Tools Discussed/Used     Consult Status Consult Status: Complete    Pamelia HoitWeeks, Zalen Sequeira D 01/15/2016, 9:57 AM

## 2016-01-15 NOTE — Discharge Summary (Signed)
OB Discharge Summary  Patient Name: Charlotte Giles DOB: [birth year] MRN: [id number]  Date of admission: 01/13/2016 Delivering MD: COUSINS, SHERONETTE   Date of discharge: 01/15/2016  Admitting diagnosis: direct admit 40.5w Intrauterine pregnancy: [redacted]w[redacted]d     Secondary diagnosis:Principal Problem:   Postpartum care following vaginal delivery (3/25) Active Problems:   Post-dates pregnancy, delivered, current hospitalization  Additional problems:none     Discharge diagnosis: Term Pregnancy Delivered                                                                     Post partum procedures:none  Augmentation: AROM, Pitocin and Cytotec  Complications: None  Hospital course:  Induction of Labor With Vaginal Delivery   30 y.o. yo G2P2002 at [redacted]w[redacted]d was admitted to the hospital 01/13/2016 for induction of labor.  Indication for induction: Favorable cervix at term.  Patient had an uncomplicated labor course as follows: Membrane Rupture Time/Date: 8:02 AM ,01/14/2016   Intrapartum Procedures: Episiotomy: None [1]                                         Lacerations:  1st degree [2];Labial [10];Perineal [11]  Patient had delivery of a Viable infant.  Information for the patient's newborn:  Nettie, Wyffels [[id number]]  Delivery Method: Vaginal, Spontaneous Delivery (Filed from Delivery Summary)   01/14/2016  Details of delivery can be found in separate delivery note.  Patient had a routine postpartum course. Patient is discharged home 01/15/2016.   Physical exam  Filed Vitals:   01/14/16 1140 01/14/16 1520 01/14/16 2327 01/15/16 0543  BP: 95/74 111/78 95/63 107/73  Pulse: 66 68 75 74  Temp: 98.3 F (36.8 C) 98.2 F (36.8 C) 97.7 F (36.5 C) 97.8 F (36.6 C)  TempSrc: Oral Axillary Oral Oral  Resp: Height:      Weight:      SpO2:   99% 99%   General: alert Lochia: appropriate Uterine Fundus: firm Incision: N/A DVT Evaluation: No evidence of DVT seen on  physical exam. Labs: Lab Results  Component Value Date   WBC 13.9* 01/15/2016   HGB 10.6* 01/15/2016   HCT 31.0* 01/15/2016   MCV 90.6 01/15/2016   PLT 169 01/15/2016   CMP Latest Ref Rng 01/14/2016  Glucose 65 - 99 mg/dL 782(N)  BUN 6 - 20 mg/dL 7  Creatinine 5.62 - 1.30 mg/dL 8.65  Sodium 784 - 696 mmol/L 136  Potassium 3.5 - 5.1 mmol/L 3.4(L)  Chloride 101 - 111 mmol/L 104  CO2 22 - 32 mmol/L 25  Calcium 8.9 - 10.3 mg/dL 8.3(L)  Total Protein 6.5 - 8.1 g/dL 2.9(B)  Total Bilirubin 0.3 - 1.2 mg/dL 0.8  Alkaline Phos 38 - 126 U/L 199(H)  AST 15 - 41 U/L 25  ALT 14 - 54 U/L 14    Discharge instruction: per After Visit Summary and "Baby and Me Booklet".  After Visit Meds:    Medication List    TAKE these medications        acetaminophen 500 MG tablet  Commonly known as:  TYLENOL  Take 1,000 mg  by mouth daily as needed for headache.     ibuprofen 600 MG tablet  Commonly known as:  ADVIL,MOTRIN  Take 1 tablet (600 mg total) by mouth every 6 (six) hours.     prenatal multivitamin Tabs tablet  Take 1 tablet by mouth daily at 12 noon.        Diet: routine diet  Activity: Advance as tolerated. Pelvic rest for 6 weeks.   Outpatient follow up:6 weeks Follow up Appt:No future appointments. Follow up visit: No Follow-up on file.  Postpartum contraception: Undecided  Newborn Data: Live born female  Birth Weight: 7 lb 7.1 oz (3375 g) APGAR: 9, 9  Baby Feeding: Breast Disposition:home with mother   01/15/2016 Lendon ColonelFOGLEMAN,Bakari Nikolai A., MD

## 2016-01-15 NOTE — Progress Notes (Signed)
Post Partum Day 1 S/P induced vaginal  Feeding: breast Subjective: No HA, SOB, CP, F/C, breast symptoms. Normal vaginal bleeding, no clots. Pain controlled.  ambulating without symptoms.  voiding without difficulty , + flatus. Pt would like early d/c today. Has 29 yr old at home.   Objective: BP 107/73 mmHg  Pulse 74  Temp(Src) 97.8 F (36.6 C) (Oral)  Resp 18  Ht 5\' 1"  (1.549 m)  Wt 63.504 kg (140 lb)  BMI 26.47 kg/m2  SpO2 99%  LMP 04/03/2015  Breastfeeding? Unknown I&O reviewed.   Physical Exam:  General: alert and no distress Lochia: appropriate Uterine Fundus: firm DVT Evaluation: No evidence of DVT seen on physical exam. Ext: No c/c/e  Recent Labs  01/14/16 1041 01/15/16 0531  HGB 10.8* 10.6*  HCT 32.2* 31.0*      Assessment/Plan: 30 y.o.  PPD #1 .  normal postpartum exam Continue current postpartum care Minimal anemia, asymptomatic, OTC iron vs iron rich foods at home D/c home.   LOS: 2 days   Dustine Bertini A. 01/15/2016 10:08 AM

## 2016-02-24 DIAGNOSIS — Z130 Encounter for screening for diseases of the blood and blood-forming organs and certain disorders involving the immune mechanism: Secondary | ICD-10-CM | POA: Diagnosis not present

## 2017-01-15 ENCOUNTER — Encounter: Payer: Self-pay | Admitting: Gynecology

## 2017-01-15 ENCOUNTER — Ambulatory Visit (INDEPENDENT_AMBULATORY_CARE_PROVIDER_SITE_OTHER): Payer: BLUE CROSS/BLUE SHIELD | Admitting: Gynecology

## 2017-01-15 VITALS — BP 114/66 | Ht 61.0 in | Wt 117.0 lb

## 2017-01-15 DIAGNOSIS — Z8041 Family history of malignant neoplasm of ovary: Secondary | ICD-10-CM | POA: Diagnosis not present

## 2017-01-15 DIAGNOSIS — Z01419 Encounter for gynecological examination (general) (routine) without abnormal findings: Secondary | ICD-10-CM

## 2017-01-15 LAB — CBC WITH DIFFERENTIAL/PLATELET
BASOS PCT: 0 %
Basophils Absolute: 0 cells/uL (ref 0–200)
EOS ABS: 85 {cells}/uL (ref 15–500)
Eosinophils Relative: 1 %
HCT: 38.5 % (ref 35.0–45.0)
Hemoglobin: 12.3 g/dL (ref 11.7–15.5)
LYMPHS PCT: 31 %
Lymphs Abs: 2635 cells/uL (ref 850–3900)
MCH: 29.3 pg (ref 27.0–33.0)
MCHC: 31.9 g/dL — ABNORMAL LOW (ref 32.0–36.0)
MCV: 91.7 fL (ref 80.0–100.0)
MONO ABS: 680 {cells}/uL (ref 200–950)
MPV: 11 fL (ref 7.5–12.5)
Monocytes Relative: 8 %
NEUTROS ABS: 5100 {cells}/uL (ref 1500–7800)
Neutrophils Relative %: 60 %
Platelets: 219 10*3/uL (ref 140–400)
RBC: 4.2 MIL/uL (ref 3.80–5.10)
RDW: 12.9 % (ref 11.0–15.0)
WBC: 8.5 10*3/uL (ref 3.8–10.8)

## 2017-01-15 LAB — COMPREHENSIVE METABOLIC PANEL
ALT: 14 U/L (ref 6–29)
AST: 15 U/L (ref 10–30)
Albumin: 4.1 g/dL (ref 3.6–5.1)
Alkaline Phosphatase: 38 U/L (ref 33–115)
BUN: 15 mg/dL (ref 7–25)
CALCIUM: 9.3 mg/dL (ref 8.6–10.2)
CO2: 25 mmol/L (ref 20–31)
Chloride: 104 mmol/L (ref 98–110)
Creat: 0.81 mg/dL (ref 0.50–1.10)
Glucose, Bld: 82 mg/dL (ref 65–99)
Potassium: 3.6 mmol/L (ref 3.5–5.3)
SODIUM: 136 mmol/L (ref 135–146)
Total Bilirubin: 1 mg/dL (ref 0.2–1.2)
Total Protein: 6.7 g/dL (ref 6.1–8.1)

## 2017-01-15 LAB — CHOLESTEROL, TOTAL: Cholesterol: 116 mg/dL (ref <200)

## 2017-01-15 NOTE — Addendum Note (Signed)
Addended by: Dayna BarkerGARDNER, KIMBERLY K on: 01/15/2017 02:44 PM   Modules accepted: Orders

## 2017-01-15 NOTE — Progress Notes (Signed)
Charlotte Giles 1986/02/22 [id number]   History:    31 y.o.  for annual gyn exam who has not been seen the office since 2016 since she had her second child. She is now gravida 2 para 2 both children delivered vaginally. Patient has informed me that her mother recently was diagnosed with advanced stage ovarian cancer and was serous adenocarcinoma and that she was BRCA one BRCA2 but we'll obtain more information test to the tumor markers and discuss with her GYN oncologist if she needs closer surveillance because of her mother. Patient reports normal menstrual cycles.  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  Gynecologic History Patient's last menstrual period was 12/18/2016. Contraception: condoms Last Pap: See above. Results were: See above Last mammogram: Not indicated. Results were: Not indicated  Obstetric History OB History  Gravida Para Term Preterm AB Living  _0 SAB TAB Ectopic Multiple Live Births        0 2    # Outcome Date GA Lbr Len/2nd Weight Sex Delivery Anes PTL Lv  2 Term 01/14/16 [id number]:01 / 00:13 7 lb 7.1 oz (3.375 kg) M Vag-Spont EPI  LIV  1 Term 12/11/13 428w4d8:44 / 01:09 7 lb 8.6 oz (3.419 kg) M Vag-Spont EPI, Local  LIV       ROS: A ROS was performed and pertinent positives and negatives are included in the history.  GENERAL: No fevers or chills. HEENT: No change in vision, no earache,  sore throat or sinus congestion. NECK: No pain or stiffness. CARDIOVASCULAR: No chest pain or pressure. No palpitations. PULMONARY: No shortness of breath, cough or wheeze. GASTROINTESTINAL: No abdominal pain, nausea, vomiting or diarrhea, melena or bright red blood per rectum. GENITOURINARY: No urinary frequency, urgency, hesitancy or dysuria. MUSCULOSKELETAL: No joint or muscle pain, no back pain, no recent trauma. DERMATOLOGIC: No rash, no itching, no lesions. ENDOCRINE: No polyuria, polydipsia, no heat or cold intolerance. No recent change in weight. HEMATOLOGICAL: No anemia or easy bruising or bleeding. NEUROLOGIC: No headache, seizures, numbness, tingling or weakness. PSYCHIATRIC: No depression, no loss of interest in normal activity or change in sleep pattern.     Exam: chaperone present  BP 114/66   Ht 5' 1" (1.549 m)   Wt 117 lb (53.1 kg)   LMP 12/18/2016   BMI 22.11 kg/m   Body mass index is 22.11 kg/m.  General appearance : Well developed well nourished female. No acute distress HEENT: Eyes: no retinal hemorrhage or exudates,  Neck supple, trachea midline, no carotid bruits, no thyroidmegaly Lungs: Clear to auscultation, no rhonchi or wheezes, or rib retractions  Heart: Regular rate and rhythm, no murmurs or gallops Breast:Examined in sitting and supine position were symmetrical in appearance, no palpable masses or tenderness,  no skin retraction, no nipple inversion, no nipple discharge, no skin discoloration, no axillary or supraclavicular lymphadenopathy Abdomen: no palpable masses or tenderness, no rebound or guarding Extremities: no edema  or skin discoloration or tenderness  Pelvic:  Bartholin, Urethra, Skene Glands: Within normal limits             Vagina: No gross lesions or discharge  Cervix: No gross lesions or discharge  Uterus  anteverted, normal size, shape and consistency, non-tender and mobile  Adnexa  Without masses or tenderness  Anus and perineum  normal    Rectovaginal  normal sphincter tone without palpated masses or tenderness             Hemoccult not indicated     Assessment/Plan:  31 y.o. female for annual exam whose mother was diagnosed with ovarian cancer recently at the age of 53 advanced stage BRCA one BRCA2 negative. I've asked the patient to talk with her mother so that her mother will talk to her GYN oncologist to see if any additional genetic testing is required for her aside firm perhaps periodic pelvic ultrasound. We will schedule an ultrasound in the office the next few weeks other patient's fully aware of its limitations. CBC, comprehensive metabolic panel and screening cholesterol was obtained today. Pap smear was obtained today as well. We discussed the importance of consideration of oral contraceptive pill for ovarian cyst suppression and for prophylaxis to ovarian cancer and patient stated she will think about it.   , H MD, 2:16 PM 01/15/2017   

## 2017-01-17 LAB — PAP IG W/ RFLX HPV ASCU

## 2017-02-06 ENCOUNTER — Ambulatory Visit (INDEPENDENT_AMBULATORY_CARE_PROVIDER_SITE_OTHER): Payer: BLUE CROSS/BLUE SHIELD

## 2017-02-06 ENCOUNTER — Ambulatory Visit (INDEPENDENT_AMBULATORY_CARE_PROVIDER_SITE_OTHER): Payer: BLUE CROSS/BLUE SHIELD | Admitting: Gynecology

## 2017-02-06 ENCOUNTER — Other Ambulatory Visit: Payer: Self-pay | Admitting: Gynecology

## 2017-02-06 DIAGNOSIS — Z8041 Family history of malignant neoplasm of ovary: Secondary | ICD-10-CM

## 2017-02-06 DIAGNOSIS — N831 Corpus luteum cyst of ovary, unspecified side: Secondary | ICD-10-CM

## 2017-02-06 DIAGNOSIS — R9389 Abnormal findings on diagnostic imaging of other specified body structures: Secondary | ICD-10-CM

## 2017-02-06 DIAGNOSIS — R938 Abnormal findings on diagnostic imaging of other specified body structures: Secondary | ICD-10-CM | POA: Diagnosis not present

## 2017-02-07 ENCOUNTER — Telehealth: Payer: Self-pay | Admitting: Gynecology

## 2017-02-07 NOTE — Telephone Encounter (Signed)
Patient will be contacted today as result of the following ultrasound that was done yesterday. She was scheduled to discuss with me but I had to run out of due to family emergency. The reason for the ultrasound was due to the fact that her mother had been diagnosed with advanced stage ovarian cancer serous adenocarcinoma. Patient stated her mother was tested for BRCA1 and BRCA2 negative?  Ultrasound: Uterus measured 8.5 x 5.5 x 4.0 cm with endometrial stripe 18.6 mm (patient on day 18 of her cycle) prominent endometrium with positive color flow right ovary thick walled 25 x 26 mm corpus luteum cysts irregular cystic lumen positive color flow periphery. Left ovary normal. Some free fluid in the cul-de-sac right ovarian follicle 10 mm left ovarian follicle 6 mm.  Patient will be asked to return back in 3 months for follow-up ultrasound this appears to be a small follicle in may have ruptured. We'll also obtain a CA 125 ovarian cancer screening. Patient also to check with her mother what genetic testing was done because of her ovarian cancer history.

## 2017-02-13 ENCOUNTER — Other Ambulatory Visit: Payer: Self-pay | Admitting: *Deleted

## 2017-02-13 DIAGNOSIS — Z8041 Family history of malignant neoplasm of ovary: Secondary | ICD-10-CM

## 2017-03-06 ENCOUNTER — Encounter: Payer: Self-pay | Admitting: Gynecology

## 2017-04-29 ENCOUNTER — Ambulatory Visit (INDEPENDENT_AMBULATORY_CARE_PROVIDER_SITE_OTHER): Payer: BLUE CROSS/BLUE SHIELD | Admitting: Family Medicine

## 2017-04-29 ENCOUNTER — Encounter: Payer: Self-pay | Admitting: Family Medicine

## 2017-04-29 VITALS — BP 100/76 | HR 82 | Temp 98.7°F | Ht 61.0 in | Wt 118.0 lb

## 2017-04-29 DIAGNOSIS — Z7689 Persons encountering health services in other specified circumstances: Secondary | ICD-10-CM

## 2017-04-29 DIAGNOSIS — R51 Headache: Secondary | ICD-10-CM | POA: Diagnosis not present

## 2017-04-29 DIAGNOSIS — R519 Headache, unspecified: Secondary | ICD-10-CM

## 2017-04-29 NOTE — Patient Instructions (Signed)
It was a pleasure to see you today! Please schedule a follow up appointment for a physical and fasting lab work at [facility]. Also, please let me know if you would like to initiate treatment for headaches after appointment with gynecology as discussed.   Migraine Headache A migraine headache is a very strong throbbing pain on one side or both sides of your head. Migraines can also cause other symptoms. Talk with your doctor about what things may bring on (trigger) your migraine headaches. Follow these instructions at home: Medicines  Take over-the-counter and prescription medicines only as told by your doctor.  Do not drive or use heavy machinery while taking prescription pain medicine.  To prevent or treat constipation while you are taking prescription pain medicine, your doctor may recommend that you: ? Drink enough fluid to keep your pee (urine) clear or pale yellow. ? Take over-the-counter or prescription medicines. ? Eat foods that are high in fiber. These include fresh fruits and vegetables, whole grains, and beans. ? Limit foods that are high in fat and processed sugars. These include fried and sweet foods. Lifestyle  Avoid alcohol.  Do not use any products that contain nicotine or tobacco, such as cigarettes and e-cigarettes. If you need help quitting, ask your doctor.  Get at least 8 hours of sleep every night.  Limit your stress. General instructions   Keep a journal to find out what may bring on your migraines. For example, write down: ? What you eat and drink. ? How much sleep you get. ? Any change in what you eat or drink. ? Any change in your medicines.  If you have a migraine: ? Avoid things that make your symptoms worse, such as bright lights. ? It may help to lie down in a dark, quiet room. ? Do not drive or use heavy machinery. ? Ask your doctor what activities are safe for you.  Keep all follow-up visits as told by your doctor. This is  important. Contact a doctor if:  You get a migraine that is different or worse than your usual migraines. Get help right away if:  Your migraine gets very bad.  You have a fever.  You have a stiff neck.  You have trouble seeing.  Your muscles feel weak or like you cannot control them.  You start to lose your balance a lot.  You start to have trouble walking.  You pass out (faint). This information is not intended to replace advice given to you by your health care provider. Make sure you discuss any questions you have with your health care provider. Document Released: 07/17/2008 Document Revised: 04/27/2016 Document Reviewed: 03/26/2016 Elsevier Interactive Patient Education  2017 [employer]

## 2017-04-29 NOTE — Progress Notes (Signed)
Patient ID: Charlotte Giles, female   DOB: [birth year], 31 y.o.   MRN: [id number]  Patient presents to clinic today to establish care.    Headaches that are associated with menstrual cycles are noted and have been present for "several months".  She reports that this was noted after birth of her last child who is one year old and she believes headaches have been occurring once monthly for the past six months and associated with menstrual cycle. No other trigger noted. Treated with Acetaminophen 1000 mg daily and ibuprofen 600 mg daily for 3 to 4 days that provides moderate benefit.  She denies treating headache for more than 10 days/month. When headaches present, she notes pain as an 8 to 9, with photophobia, phonophobia, nausea without vomiting. Rest improves symptoms with acetaminophen and ibuprofen. She denies chest pain, palpitations, SOB, numbness, tingling, weakness, visual disturbances, dizziness, lightheadedness, or edema. No headache noted during exam with this visit. No prescription treatment has been tried at home.   Health Maintenance: Dental -Every 6 months Vision --Yearly  Immunizations --Tdap is UTD; influenza vaccine yearly  Colonoscopy --No needed Mammogram --Not needed PAP -- March 2018; yearly; she is followed by gynecology; mother has been diagnosed with ovarian cancer at the age of 51 advanced stage BRCA one and BRCA2 negative.  GYN advised periodic pelvic ultrasounds for evaluation. Patient has a follow up scheduled for discussion of ultrasound screening and plan. Last [place] 4/18 and follow up US planned for this month as there was a small follicle that may have ruptured. Also GYN plans to obtain a CA 125 ovarian cancer screening and patient is also speaking with mother to review with her oncologist any genetic testing that may be considered.     Past Medical History:  Diagnosis Date  . Acute blood loss anemia 12/12/2013  . Anemia   . 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 HSV infection     History reviewed. No pertinent surgical history.  Current Outpatient Prescriptions on File Prior to Visit  Medication Sig Dispense Refill  . acetaminophen (TYLENOL) 500 MG tablet Take 1,000 mg by mouth daily as needed for headache.    . ibuprofen (ADVIL,MOTRIN) 600 MG tablet Take 1 tablet (600 mg total) by mouth every 6 (six) hours. 60 tablet 0  . Prenatal Vit-Fe Fumarate-FA (PRENATAL MULTIVITAMIN) TABS tablet Take 1 tablet by mouth daily at 12 noon.     No current facility-administered medications on file prior to visit.     No Known Allergies  Family History  Problem Relation Age of Onset  . Hypertension Mother   . Ovarian cancer Mother   . Cancer Mother   . Cancer Father 15       PROSTATE  . Diabetes Maternal Grandmother   . Hypertension Maternal Grandmother     Social History   Social History  . Marital status: Married    Spouse name: N/A  . Number of children: N/A  . Years of education: N/A   Occupational History  . Not on file.   Social History Main Topics  . Smoking status: Never Smoker  . Smokeless tobacco: Never Used  . Alcohol use 0.0 oz/week     Comment: OCCASIONALLY  . Drug use: No  . Sexual activity: Yes    Birth control/ protection: Condom   Other Topics Concern  . Not on file   Social History Narrative  . No narrative on file    Review  of Systems  Constitutional: Negative for chills and fever.  Respiratory: Negative for cough, shortness of breath and wheezing.   Cardiovascular: Negative for chest pain.  Gastrointestinal: Negative for abdominal pain, blood in stool, diarrhea, heartburn, nausea and vomiting.  Genitourinary: Negative for dysuria, frequency, hematuria and urgency.  Musculoskeletal: Negative for back pain and myalgias.  Skin: Negative for rash.  Neurological: Positive for headaches. Negative for dizziness, tingling, tremors and weakness.  Psychiatric/Behavioral:       Denies  depressed or anxious mood    BP 100/76 (BP Location: Left Arm, Patient Position: Sitting, Cuff Size: Normal)   Pulse 82   Temp 98.7 F (37.1 C) (Oral)   Ht 5' 1"  (1.549 m)   Wt 118 lb (53.5 kg)   LMP 04/10/2017 (Exact Date)   SpO2 99%   BMI 22.30 kg/m   Physical Exam  Constitutional: She is oriented to person, place, and time and well-developed, well-nourished, and in no distress.  HENT:  Right Ear: Tympanic membrane normal.  Left Ear: Tympanic membrane normal.  Nose: No rhinorrhea. Right sinus exhibits no maxillary sinus tenderness and no frontal sinus tenderness. Left sinus exhibits no maxillary sinus tenderness and no frontal sinus tenderness.  Mouth/Throat: Oropharynx is clear and moist and mucous membranes are normal.  Eyes: Pupils are equal, round, and reactive to light. No scleral icterus.  Neck: Neck supple.  Cardiovascular: Normal rate, regular rhythm and intact distal pulses.   Pulmonary/Chest: Effort normal and breath sounds normal. She has no wheezes. She has no rales.  Abdominal: Soft. Bowel sounds are normal. There is no tenderness.  Musculoskeletal: She exhibits no edema.  Lymphadenopathy:    She has no cervical adenopathy.  Neurological: She is alert and oriented to person, place, and time. Gait normal.  II-Visual fields grossly intact. III/IV/VI-Extraocular movements intact. Pupils reactive bilaterally. V/VII-Smile symmetric, equal eyebrow raise, facial sensation intact VIII- Hearing grossly intact XI-bilateral shoulder shrug XII-midline tongue extension Motor: 5/5 bilaterally with normal tone and bulk Cerebellar: Normal finger-to-nose.  Ambulates with a coordinated gait  Skin: Skin is warm and dry. No rash noted.  Psychiatric: Mood, memory, affect and judgment normal.    Assessment/Plan: 1. Nonintractable episodic headache, unspecified headache type Episodic and related to menstrual cycle; she has a follow up with gynecology for evaluation of an  ultrasound and she will discuss headaches for possible consideration of an oral contraceptive after [place] and CA 125 screening has been completed. We reviewed options for treatment including sumatriptan and she will consider this option after meeting with gynecologist for [place] results.  Advised use of ibuprofen 600 mg every 6 hours with food and acetaminophen can also be rotated if needed. Follow up in the next 3 months for physical and lab work or sooner if needed.  2. Encounter to establish care We reviewed the PMH, PSH, FH, SH, Meds and Allergies. -We provided refills for any medications we will prescribe as needed. -We addressed current concerns per orders and patient instructions. -We have asked for records for pertinent exams, studies, vaccines and notes from previous providers. -We have advised patient to follow up per instructions below.   -Patient advised to return or notify a provider immediately if symptoms worsen or persist or new concerns arise.  Follow up for CPE and lab work.  Delano Metz, FNP-C

## 2017-04-30 NOTE — Addendum Note (Signed)
Addended by: Roddie McKORDSMEIER, Nigel Ericsson A on: 04/30/2017 02:30 PM   Modules accepted: Level of Service

## 2017-05-20 ENCOUNTER — Encounter: Payer: Self-pay | Admitting: Gynecology

## 2017-05-20 ENCOUNTER — Ambulatory Visit (INDEPENDENT_AMBULATORY_CARE_PROVIDER_SITE_OTHER): Payer: BLUE CROSS/BLUE SHIELD

## 2017-05-20 ENCOUNTER — Other Ambulatory Visit: Payer: Self-pay | Admitting: Gynecology

## 2017-05-20 ENCOUNTER — Ambulatory Visit (INDEPENDENT_AMBULATORY_CARE_PROVIDER_SITE_OTHER): Payer: BLUE CROSS/BLUE SHIELD | Admitting: Gynecology

## 2017-05-20 VITALS — BP 120/82

## 2017-05-20 DIAGNOSIS — Z8041 Family history of malignant neoplasm of ovary: Secondary | ICD-10-CM

## 2017-05-20 DIAGNOSIS — Z8742 Personal history of other diseases of the female genital tract: Secondary | ICD-10-CM

## 2017-05-20 DIAGNOSIS — N83201 Unspecified ovarian cyst, right side: Secondary | ICD-10-CM

## 2017-05-20 NOTE — Progress Notes (Signed)
  Patient is a 31 year old who presented to the office today for an ultrasound. Patient's mother was diagnosed with ovarian cancer at advanced age and was found to have serous adenocarcinoma and was tested and was negative for the BRCA1 and BRCA2 gene mutation.  Patient had an ultrasound in April of this year and the following was noted: Ultrasound: Uterus measured 8.5 x 5.5 x 4.0 cm with endometrial stripe 18.6 mm (patient on day 18 of her cycle) prominent endometrium with positive color flow right ovary thick walled 25 x 26 mm corpus luteum cysts irregular cystic lumen positive color flow periphery. Left ovary normal. Some free fluid in the cul-de-sac right ovarian follicle 10 mm left ovarian follicle 6 mm.  Follow-up ultrasound today: Uterus measured 80.5 x 4.9 x 3.7 cm with endometrial stripe of 9.6 mm. Patient on day 14 of her cycle. Left ovary normal. Right ovary previous cyst not seen a small right ovarian follicle with negative color flow was noted simple echo-free. No fluid in the cul-de-sac. No adnexal masses.  Assessment/plan: Patient was drawn family history whereby her mother was diagnosed with advanced stage adenocarcinoma of the ovary and was tested and was BRCA1 and BRCA2 negative. We discussed the patient considering being on the oral contraceptive pill not only for contraception but ovarian cyst suppression and for ovarian cancer prevention and she will think about it in the meantime she is using barrier contraception. Patient otherwise scheduled to return to the office next year for annual exam or when necessary.

## 2018-01-17 ENCOUNTER — Encounter: Payer: Self-pay | Admitting: Obstetrics & Gynecology

## 2018-01-17 ENCOUNTER — Ambulatory Visit (INDEPENDENT_AMBULATORY_CARE_PROVIDER_SITE_OTHER): Payer: BLUE CROSS/BLUE SHIELD | Admitting: Obstetrics & Gynecology

## 2018-01-17 VITALS — BP 128/86 | Ht 61.0 in | Wt 117.0 lb

## 2018-01-17 DIAGNOSIS — Z01419 Encounter for gynecological examination (general) (routine) without abnormal findings: Secondary | ICD-10-CM | POA: Diagnosis not present

## 2018-01-17 DIAGNOSIS — Z789 Other specified health status: Secondary | ICD-10-CM | POA: Diagnosis not present

## 2018-01-17 DIAGNOSIS — Z1151 Encounter for screening for human papillomavirus (HPV): Secondary | ICD-10-CM | POA: Diagnosis not present

## 2018-01-17 DIAGNOSIS — Z8041 Family history of malignant neoplasm of ovary: Secondary | ICD-10-CM | POA: Diagnosis not present

## 2018-01-17 NOTE — Progress Notes (Signed)
Charlotte Giles 09-01-1986 [id number]   History:    32 y.o. G2P2L2 married.  Sons are 87 and 97 year-old.  RP:  Established patient presenting for annual gyn exam   HPI: Menses regular, normal flow, every month.  Using condoms.  No pelvic pain.  Normal vaginal secretions.  No pain with intercourse.  Urine and bowel movements normal.  Breasts normal.  Mother with a history of ovarian cancer at age 3, which was advanced at diagnosis.  Genetic testing on mother showed negative BRCA 1 and 2.  No other gynecologic or colon cancer in the family.  Patient had a negative pelvic ultrasound July 2018.  She declined birth control pills as a way to decrease her risk of ovarian cancer.  Body mass index 22.11.  Very fit with high intensity training.  Past medical history,surgical history, family history and social history were all reviewed and documented in the EPIC chart.  Gynecologic History Patient's last menstrual period was 12/26/2017. Contraception: condoms Last Pap: 12/2016. Results were: Negative Last mammogram: Never Bone Density: Never Colonoscopy: Never  Obstetric History OB History  Gravida Para Term Preterm AB Living  2 2 2     2   SAB TAB Ectopic Multiple Live Births        0 2    # Outcome Date GA Lbr Len/2nd Weight Sex Delivery Anes PTL Lv  2 Term 01/14/16 63w6d02:01 / 00:13 7 lb 7.1 oz (3.375 kg) M Vag-Spont EPI  LIV  1 Term 12/11/13 472w4d8:44 / 01:09 7 lb 8.6 oz (3.419 kg) M Vag-Spont EPI, Local  LIV     ROS: A ROS was performed and pertinent positives and negatives are included in the history.  GENERAL: No fevers or chills. HEENT: No change in vision, no earache, sore throat or sinus congestion. NECK: No pain or stiffness. CARDIOVASCULAR: No chest pain or pressure. No palpitations. PULMONARY: No shortness of breath, cough or wheeze. GASTROINTESTINAL: No abdominal pain, nausea, vomiting or diarrhea, melena or bright red blood per rectum. GENITOURINARY: No urinary  frequency, urgency, hesitancy or dysuria. MUSCULOSKELETAL: No joint or muscle pain, no back pain, no recent trauma. DERMATOLOGIC: No rash, no itching, no lesions. ENDOCRINE: No polyuria, polydipsia, no heat or cold intolerance. No recent change in weight. HEMATOLOGICAL: No anemia or easy bruising or bleeding. NEUROLOGIC: No headache, seizures, numbness, tingling or weakness. PSYCHIATRIC: No depression, no loss of interest in normal activity or change in sleep pattern.     Exam:   BP 128/86   Ht 5' 1"  (1.549 m)   Wt 117 lb (53.1 kg)   LMP 12/26/2017 Comment: no birth control   BMI 22.11 kg/m   Body mass index is 22.11 kg/m.  General appearance : Well developed well nourished female. No acute distress HEENT: Eyes: no retinal hemorrhage or exudates,  Neck supple, trachea midline, no carotid bruits, no thyroidmegaly Lungs: Clear to auscultation, no rhonchi or wheezes, or rib retractions  Heart: Regular rate and rhythm, no murmurs or gallops Breast:Examined in sitting and supine position were symmetrical in appearance, no palpable masses or tenderness,  no skin retraction, no nipple inversion, no nipple discharge, no skin discoloration, no axillary or supraclavicular lymphadenopathy Abdomen: no palpable masses or tenderness, no rebound or guarding Extremities: no edema or skin discoloration or tenderness  Pelvic: Vulva: Normal             Vagina: No gross lesions or discharge  Cervix: No gross lesions or discharge.  Pap/HR HPV  done  Uterus  AV, normal size, shape and consistency, non-tender and mobile  Adnexa  Without masses or tenderness  Anus: Normal   Assessment/Plan:  32 y.o. female for annual exam   1. Encounter for routine gynecological examination with Papanicolaou smear of cervix Normal gynecologic exam.  Pap with high-risk HPV done.  Breast exam normal.  Continue with fitness. - PAP,TP IMGw/HPV RNA,rflx [id number],54/86  2. Special screening examination for human  papillomavirus (HPV)  - PAP,TP IMGw/HPV RNA,rflx [id number],79/64  3. Use of condoms for contraception Declines other types of contraception.  4. Family history of ovarian carcinoma Mother diagnosed with advanced serous adenocarcinoma of the ovary at age 34.  Genetic screening negative.  Follow-up with a  pelvic ultrasound to screen for ovarian cancer. - US Transvaginal Non-OB; Future   Princess Bruins MD, 2:52 PM 01/17/2018

## 2018-01-17 NOTE — Patient Instructions (Signed)
1. Encounter for routine gynecological examination with Papanicolaou smear of cervix Normal gynecologic exam.  Pap with high-risk HPV done.  Breast exam normal.  Continue with fitness. - PAP,TP IMGw/HPV RNA,rflx [id number],44/62  2. Special screening examination for human papillomavirus (HPV)  - PAP,TP IMGw/HPV RNA,rflx [id number],65/79  3. Use of condoms for contraception Declines other types of contraception.  4. Family history of ovarian carcinoma Mother diagnosed with advanced serous adenocarcinoma of the ovary at age 31.  Genetic screening negative.  Follow-up with a  pelvic ultrasound to screen for ovarian cancer. - US Transvaginal Non-OB; Future  Charlotte Giles, it was a pleasure meeting you today!  I will inform you of your results as soon as they are available.  Health Maintenance, Female Adopting a healthy lifestyle and getting preventive care can go a long way to promote health and wellness. Talk with your health care provider about what schedule of regular examinations is right for you. This is a good chance for you to check in with your provider about disease prevention and staying healthy. In between checkups, there are plenty of things you can do on your own. Experts have done a lot of research about which lifestyle changes and preventive measures are most likely to keep you healthy. Ask your health care provider for more information. Weight and diet Eat a healthy diet  Be sure to include plenty of vegetables, fruits, low-fat dairy products, and lean protein.  Do not eat a lot of foods high in solid fats, added sugars, or salt.  Get regular exercise. This is one of the most important things you can do for your health. ? Most adults should exercise for at least 150 minutes each week. The exercise should increase your heart rate and make you sweat (moderate-intensity exercise). ? Most adults should also do strengthening exercises at least twice a week. This is in addition to the  moderate-intensity exercise.  Maintain a healthy weight  Body mass index (BMI) is a measurement that can be used to identify possible weight problems. It estimates body fat based on height and weight. Your health care provider can help determine your BMI and help you achieve or maintain a healthy weight.  For females 76 years of age and older: ? A BMI below 18.5 is considered underweight. ? A BMI of 18.5 to 24.9 is normal. ? A BMI of 25 to 29.9 is considered overweight. ? A BMI of 30 and above is considered obese.  Watch levels of cholesterol and blood lipids  You should start having your blood tested for lipids and cholesterol at 31 years of age, then have this test every 5 years.  You may need to have your cholesterol levels checked more often if: ? Your lipid or cholesterol levels are high. ? You are older than 31 years of age. ? You are at high risk for heart disease.  Cancer screening Lung Cancer  Lung cancer screening is recommended for adults 26-41 years old who are at high risk for lung cancer because of a history of smoking.  A yearly low-dose CT scan of the lungs is recommended for people who: ? Currently smoke. ? Have quit within the past 15 years. ? Have at least a 30-pack-year history of smoking. A pack year is smoking an average of one pack of cigarettes a day for 1 year.  Yearly screening should continue until it has been 15 years since you quit.  Yearly screening should stop if you develop a health problem that would  prevent you from having lung cancer treatment.  Breast Cancer  Practice breast self-awareness. This means understanding how your breasts normally appear and feel.  It also means doing regular breast self-exams. Let your health care provider know about any changes, no matter how small.  If you are in your 20s or 30s, you should have a clinical breast exam (CBE) by a health care provider every 1-3 years as part of a regular health exam.  If you  are 39 or older, have a CBE every year. Also consider having a breast X-ray (mammogram) every year.  If you have a family history of breast cancer, talk to your health care provider about genetic screening.  If you are at high risk for breast cancer, talk to your health care provider about having an MRI and a mammogram every year.  Breast cancer gene (BRCA) assessment is recommended for women who have family members with BRCA-related cancers. BRCA-related cancers include: ? Breast. ? Ovarian. ? Tubal. ? Peritoneal cancers.  Results of the assessment will determine the need for genetic counseling and BRCA1 and BRCA2 testing.  Cervical Cancer Your health care provider may recommend that you be screened regularly for cancer of the pelvic organs (ovaries, uterus, and vagina). This screening involves a pelvic examination, including checking for microscopic changes to the surface of your cervix (Pap test). You may be encouraged to have this screening done every 3 years, beginning at age 76.  For women ages 34-65, health care providers may recommend pelvic exams and Pap testing every 3 years, or they may recommend the Pap and pelvic exam, combined with testing for human papilloma virus (HPV), every 5 years. Some types of HPV increase your risk of cervical cancer. Testing for HPV may also be done on women of any age with unclear Pap test results.  Other health care providers may not recommend any screening for nonpregnant women who are considered low risk for pelvic cancer and who do not have symptoms. Ask your health care provider if a screening pelvic exam is right for you.  If you have had past treatment for cervical cancer or a condition that could lead to cancer, you need Pap tests and screening for cancer for at least 20 years after your treatment. If Pap tests have been discontinued, your risk factors (such as having a new sexual partner) need to be reassessed to determine if screening should  resume. Some women have medical problems that increase the chance of getting cervical cancer. In these cases, your health care provider may recommend more frequent screening and Pap tests.  Colorectal Cancer  This type of cancer can be detected and often prevented.  Routine colorectal cancer screening usually begins at 31 years of age and continues through 31 years of age.  Your health care provider may recommend screening at an earlier age if you have risk factors for colon cancer.  Your health care provider may also recommend using home test kits to check for hidden blood in the stool.  A small camera at the end of a tube can be used to examine your colon directly (sigmoidoscopy or colonoscopy). This is done to check for the earliest forms of colorectal cancer.  Routine screening usually begins at age 41.  Direct examination of the colon should be repeated every 5-10 years through 31 years of age. However, you may need to be screened more often if early forms of precancerous polyps or small growths are found.  Skin Cancer  Check your  skin from head to toe regularly.  Tell your health care provider about any new moles or changes in moles, especially if there is a change in a mole's shape or color.  Also tell your health care provider if you have a mole that is larger than the size of a pencil eraser.  Always use sunscreen. Apply sunscreen liberally and repeatedly throughout the day.  Protect yourself by wearing long sleeves, pants, a wide-brimmed hat, and sunglasses whenever you are outside.  Heart disease, diabetes, and high blood pressure  High blood pressure causes heart disease and increases the risk of stroke. High blood pressure is more likely to develop in: ? People who have blood pressure in the high end of the normal range (130-139/85-89 mm Hg). ? People who are overweight or obese. ? People who are African American.  If you are 30-28 years of age, have your blood  pressure checked every 3-5 years. If you are 52 years of age or older, have your blood pressure checked every year. You should have your blood pressure measured twice-once when you are at a hospital or clinic, and once when you are not at a hospital or clinic. Record the average of the two measurements. To check your blood pressure when you are not at a hospital or clinic, you can use: ? An automated blood pressure machine at a pharmacy. ? A home blood pressure monitor.  If you are between 34 years and 73 years old, ask your health care provider if you should take aspirin to prevent strokes.  Have regular diabetes screenings. This involves taking a blood sample to check your fasting blood sugar level. ? If you are at a normal weight and have a low risk for diabetes, have this test once every three years after 31 years of age. ? If you are overweight and have a high risk for diabetes, consider being tested at a younger age or more often. Preventing infection Hepatitis B  If you have a higher risk for hepatitis B, you should be screened for this virus. You are considered at high risk for hepatitis B if: ? You were born in a country where hepatitis B is common. Ask your health care provider which countries are considered high risk. ? Your parents were born in a high-risk country, and you have not been immunized against hepatitis B (hepatitis B vaccine). ? You have HIV or AIDS. ? You use needles to inject street drugs. ? You live with someone who has hepatitis B. ? You have had sex with someone who has hepatitis B. ? You get hemodialysis treatment. ? You take certain medicines for conditions, including cancer, organ transplantation, and autoimmune conditions.  Hepatitis C  Blood testing is recommended for: ? Everyone born from [birth year] through 1964. ? Anyone with known risk factors for hepatitis C.  Sexually transmitted infections (STIs)  You should be screened for sexually transmitted  infections (STIs) including gonorrhea and chlamydia if: ? You are sexually active and are younger than 31 years of age. ? You are older than 31 years of age and your health care provider tells you that you are at risk for this type of infection. ? Your sexual activity has changed since you were last screened and you are at an increased risk for chlamydia or gonorrhea. Ask your health care provider if you are at risk.  If you do not have HIV, but are at risk, it may be recommended that you take a prescription medicine daily  to prevent HIV infection. This is called pre-exposure prophylaxis (PrEP). You are considered at risk if: ? You are sexually active and do not regularly use condoms or know the HIV status of your partner(s). ? You take drugs by injection. ? You are sexually active with a partner who has HIV.  Talk with your health care provider about whether you are at high risk of being infected with HIV. If you choose to begin PrEP, you should first be tested for HIV. You should then be tested every 3 months for as long as you are taking PrEP. Pregnancy  If you are premenopausal and you may become pregnant, ask your health care provider about preconception counseling.  If you may become pregnant, take 400 to 800 micrograms (mcg) of folic acid every day.  If you want to prevent pregnancy, talk to your health care provider about birth control (contraception). Osteoporosis and menopause  Osteoporosis is a disease in which the bones lose minerals and strength with aging. This can result in serious bone fractures. Your risk for osteoporosis can be identified using a bone density scan.  If you are 14 years of age or older, or if you are at risk for osteoporosis and fractures, ask your health care provider if you should be screened.  Ask your health care provider whether you should take a calcium or vitamin D supplement to lower your risk for osteoporosis.  Menopause may have certain physical  symptoms and risks.  Hormone replacement therapy may reduce some of these symptoms and risks. Talk to your health care provider about whether hormone replacement therapy is right for you. Follow these instructions at home:  Schedule regular health, dental, and eye exams.  Stay current with your immunizations.  Do not use any tobacco products including cigarettes, chewing tobacco, or electronic cigarettes.  If you are pregnant, do not drink alcohol.  If you are breastfeeding, limit how much and how often you drink alcohol.  Limit alcohol intake to no more than 1 drink per day for nonpregnant women. One drink equals 12 ounces of beer, 5 ounces of wine, or 1 ounces of hard liquor.  Do not use street drugs.  Do not share needles.  Ask your health care provider for help if you need support or information about quitting drugs.  Tell your health care provider if you often feel depressed.  Tell your health care provider if you have ever been abused or do not feel safe at home. This information is not intended to replace advice given to you by your health care provider. Make sure you discuss any questions you have with your health care provider. Document Released: 04/23/2011 Document Revised: 03/15/2016 Document Reviewed: 07/12/2015 Elsevier Interactive Patient Education  [employer].

## 2018-01-17 NOTE — Progress Notes (Signed)
Charlotte Giles 1986-05-19 [id number]   History:    32 y.o. G2P2L2 married.  Sons are 19 and 13 year-old.  RP:  Established patient presenting for annual gyn exam   HPI: Menses regular, normal flow, every month.  Using condoms.  No pelvic pain.  Normal vaginal secretions.  No pain with intercourse.  Urine and bowel movements normal.  Breasts normal.  Mother with a history of ovarian cancer at age 76, which was advanced at diagnosis.  Genetic testing on mother showed negative BRCA 1 and 2.  No other gynecologic or colon cancer in the family.  Patient had a negative pelvic ultrasound July 2018.  She declined birth control pills as a way to decrease her risk of ovarian cancer.  Body mass index 22.11.  Very fit with high intensity training.  Past medical history,surgical history, family history and social history were all reviewed and documented in the EPIC chart.  Gynecologic History Patient's last menstrual period was 12/26/2017. Contraception: condoms Last Pap: 12/2016. Results were: Negative Last mammogram: Never Bone Density: Never Colonoscopy: Never  Obstetric History OB History  Gravida Para Term Preterm AB Living  2 2 2     2   SAB TAB Ectopic Multiple Live Births        0 2    # Outcome Date GA Lbr Len/2nd Weight Sex Delivery Anes PTL Lv  2 Term 01/14/16 36w6d02:01 / 00:13 7 lb 7.1 oz (3.375 kg) M Vag-Spont EPI  LIV  1 Term 12/11/13 477w4d8:44 / 01:09 7 lb 8.6 oz (3.419 kg) M Vag-Spont EPI, Local  LIV     ROS: A ROS was performed and pertinent positives and negatives are included in the history.  GENERAL: No fevers or chills. HEENT: No change in vision, no earache, sore throat or sinus congestion. NECK: No pain or stiffness. CARDIOVASCULAR: No chest pain or pressure. No palpitations. PULMONARY: No shortness of breath, cough or wheeze. GASTROINTESTINAL: No abdominal pain, nausea, vomiting or diarrhea, melena or bright red blood per rectum. GENITOURINARY: No urinary  frequency, urgency, hesitancy or dysuria. MUSCULOSKELETAL: No joint or muscle pain, no back pain, no recent trauma. DERMATOLOGIC: No rash, no itching, no lesions. ENDOCRINE: No polyuria, polydipsia, no heat or cold intolerance. No recent change in weight. HEMATOLOGICAL: No anemia or easy bruising or bleeding. NEUROLOGIC: No headache, seizures, numbness, tingling or weakness. PSYCHIATRIC: No depression, no loss of interest in normal activity or change in sleep pattern.     Exam:   BP 128/86   Ht 5' 1"  (1.549 m)   Wt 117 lb (53.1 kg)   LMP 12/26/2017 Comment: no birth control   BMI 22.11 kg/m   Body mass index is 22.11 kg/m.  General appearance : Well developed well nourished female. No acute distress HEENT: Eyes: no retinal hemorrhage or exudates,  Neck supple, trachea midline, no carotid bruits, no thyroidmegaly Lungs: Clear to auscultation, no rhonchi or wheezes, or rib retractions  Heart: Regular rate and rhythm, no murmurs or gallops Breast:Examined in sitting and supine position were symmetrical in appearance, no palpable masses or tenderness,  no skin retraction, no nipple inversion, no nipple discharge, no skin discoloration, no axillary or supraclavicular lymphadenopathy Abdomen: no palpable masses or tenderness, no rebound or guarding Extremities: no edema or skin discoloration or tenderness  Pelvic: Vulva: Normal             Vagina: No gross lesions or discharge  Cervix: No gross lesions or discharge.  Pap/HR HPV  done  Uterus  AV, normal size, shape and consistency, non-tender and mobile  Adnexa  Without masses or tenderness  Anus: Normal   Assessment/Plan:  32 y.o. female for annual exam   1. Encounter for routine gynecological examination with Papanicolaou smear of cervix Normal gynecologic exam.  Pap with high-risk HPV done.  Breast exam normal.  Continue with fitness. - PAP,TP IMGw/HPV RNA,rflx [id number],75/61  2. Special screening examination for human  papillomavirus (HPV)  - PAP,TP IMGw/HPV RNA,rflx [id number],88/73  3. Use of condoms for contraception Declines other types of contraception.  4. Family history of ovarian carcinoma Mother diagnosed with advanced serous adenocarcinoma of the ovary at age 66.  Genetic screening negative.  Follow-up with a  pelvic ultrasound to screen for ovarian cancer. - US Transvaginal Non-OB; Future  Princess Bruins MD, 6:21 PM 01/17/2018

## 2018-01-20 LAB — PAP, TP IMAGING W/ HPV RNA, RFLX HPV TYPE 16,18/45: HPV DNA HIGH RISK: NOT DETECTED

## 2018-02-11 ENCOUNTER — Other Ambulatory Visit: Payer: Self-pay | Admitting: Obstetrics & Gynecology

## 2018-02-11 ENCOUNTER — Encounter: Payer: Self-pay | Admitting: Obstetrics & Gynecology

## 2018-02-11 ENCOUNTER — Ambulatory Visit (INDEPENDENT_AMBULATORY_CARE_PROVIDER_SITE_OTHER): Payer: BLUE CROSS/BLUE SHIELD

## 2018-02-11 ENCOUNTER — Ambulatory Visit: Payer: BLUE CROSS/BLUE SHIELD | Admitting: Obstetrics & Gynecology

## 2018-02-11 DIAGNOSIS — Z8041 Family history of malignant neoplasm of ovary: Secondary | ICD-10-CM

## 2018-02-11 DIAGNOSIS — N8311 Corpus luteum cyst of right ovary: Secondary | ICD-10-CM | POA: Diagnosis not present

## 2018-02-11 NOTE — Patient Instructions (Signed)
  1. Family history of ovarian cancer No pelvic pain.  Normal regular menstrual cycles.  Using condoms.  Declines birth control pills after counseling about the pill decreasing the risk of ovarian cancer.  Mother with ovarian cancer diagnosed at age 31 is BRCA1-2 negative.  Pelvic ultrasound normal today.  Patient informed of ultrasound findings and reassured.  Will follow for annual gynecologic exam in March 2020.  Alger Simons, good seeing you today!

## 2018-02-11 NOTE — Progress Notes (Signed)
    Charlotte Giles Jan 06, 1986 [id number]        32 y.o.  [id number]   RP: Mother with Ovarian Cancer for Pelvic US  HPI: Normal menses every month.  Using condoms, prefers not to be on BCPs.  No pelvic pain, no pain with IC.  Mother with Dx of Ovarian Ca at age 30, she is BrCa1-2 negative.   OB History  Gravida Para Term Preterm AB Living  '2 2 2     2  '$ SAB TAB Ectopic Multiple Live Births        0 2    # Outcome Date GA Lbr Len/2nd Weight Sex Delivery Anes PTL Lv  2 Term 01/14/16 31w6d02:01 / 00:13 7 lb 7.1 oz (3.375 kg) M Vag-Spont EPI  LIV  1 Term 12/11/13 476w4d8:44 / 01:09 7 lb 8.6 oz (3.419 kg) M Vag-Spont EPI, Local  LIV    Past medical history,surgical history, problem list, medications, allergies, family history and social history were all reviewed and documented in the EPIC chart.   Directed ROS with pertinent positives and negatives documented in the history of present illness/assessment and plan.  Exam:  There were no vitals filed for this visit. General appearance:  Normal  Pelvic [place]: T/V images.  Anteverted uterus measuring 7.76 x 4.89 x 4.07 cm.  Endometrial lining normal at 12.0 mm on day 21 of the cycle.  Right ovary with a thick-walled cyst with internal low-level echoes, color flow Doppler positive at the periphery, compatible with a Corpus luteum cyst measuring 2.3 x 2.5 x 2.3 cm.  Left ovary normal.  Free fluid in the posterior cul-de-sac to the right measuring 4.2 x 1.5 cm.   Assessment/Plan:  [age].o. G2P2002   1. Family history of ovarian cancer No pelvic pain.  Normal regular menstrual cycles.  Using condoms.  Declines birth control pills after counseling about the pill decreasing the risk of ovarian cancer.  Mother with ovarian cancer diagnosed at age 1316s BRCA1-2 negative.  Pelvic ultrasound normal today.  Patient informed of ultrasound findings and reassured.  Will follow for annual gynecologic exam in March 2020.  Counseling on above issues  and coordination of care more than 50% for 15 minutes.  MaPrincess BruinsD, 3:11 PM 02/11/2018

## 2018-02-25 ENCOUNTER — Observation Stay (HOSPITAL_COMMUNITY)
Admission: EM | Admit: 2018-02-25 | Discharge: 2018-02-27 | Disposition: A | Discharge status: Home / Self Care | Payer: BLUE CROSS/BLUE SHIELD | Attending: Surgery | Admitting: Surgery

## 2018-02-25 ENCOUNTER — Emergency Department (HOSPITAL_COMMUNITY): Payer: BLUE CROSS/BLUE SHIELD | Admitting: Certified Registered"

## 2018-02-25 ENCOUNTER — Encounter (HOSPITAL_COMMUNITY): Admission: EM | Disposition: A | Discharge status: Home / Self Care | Payer: Self-pay | Source: Home / Self Care

## 2018-02-25 ENCOUNTER — Encounter (HOSPITAL_COMMUNITY): Payer: Self-pay | Admitting: Emergency Medicine

## 2018-02-25 ENCOUNTER — Encounter (HOSPITAL_COMMUNITY): Payer: Self-pay

## 2018-02-25 ENCOUNTER — Emergency Department (HOSPITAL_COMMUNITY): Payer: BLUE CROSS/BLUE SHIELD

## 2018-02-25 ENCOUNTER — Ambulatory Visit (HOSPITAL_COMMUNITY)
Admission: EM | Admit: 2018-02-25 | Discharge: 2018-02-25 | Disposition: A | Discharge status: Other Acute Inpatient Hospital | Payer: BLUE CROSS/BLUE SHIELD | Source: Home / Self Care

## 2018-02-25 ENCOUNTER — Other Ambulatory Visit: Payer: Self-pay

## 2018-02-25 DIAGNOSIS — K429 Umbilical hernia without obstruction or gangrene: Secondary | ICD-10-CM | POA: Diagnosis not present

## 2018-02-25 DIAGNOSIS — K3580 Unspecified acute appendicitis: Secondary | ICD-10-CM | POA: Diagnosis not present

## 2018-02-25 DIAGNOSIS — K37 Unspecified appendicitis: Secondary | ICD-10-CM | POA: Diagnosis not present

## 2018-02-25 DIAGNOSIS — R1031 Right lower quadrant pain: Secondary | ICD-10-CM | POA: Diagnosis not present

## 2018-02-25 DIAGNOSIS — K353 Acute appendicitis with localized peritonitis, without perforation or gangrene: Secondary | ICD-10-CM | POA: Diagnosis not present

## 2018-02-25 DIAGNOSIS — Z8719 Personal history of other diseases of the digestive system: Secondary | ICD-10-CM | POA: Insufficient documentation

## 2018-02-25 DIAGNOSIS — R102 Pelvic and perineal pain: Secondary | ICD-10-CM | POA: Diagnosis not present

## 2018-02-25 DIAGNOSIS — Z9889 Other specified postprocedural states: Secondary | ICD-10-CM

## 2018-02-25 HISTORY — PX: LAPAROSCOPIC APPENDECTOMY: SHX408

## 2018-02-25 LAB — I-STAT BETA HCG BLOOD, ED (MC, WL, AP ONLY): I-stat hCG, quantitative: <5 m[IU]/mL (ref <5)

## 2018-02-25 LAB — CBC WITH DIFFERENTIAL/PLATELET
BASOS PCT: 0 %
Basophils Absolute: 0 10*3/uL (ref 0.0–0.1)
Eosinophils Absolute: 0 10*3/uL (ref 0.0–0.7)
Eosinophils Relative: 0 %
HCT: 36 % (ref 36.0–46.0)
HEMOGLOBIN: 11.8 g/dL — AB (ref 12.0–15.0)
Lymphocytes Relative: 5 %
Lymphs Abs: 1.1 10*3/uL (ref 0.7–4.0)
MCH: 29.4 pg (ref 26.0–34.0)
MCHC: 32.8 g/dL (ref 30.0–36.0)
MCV: 89.6 fL (ref 78.0–100.0)
MONOS PCT: 6 %
Monocytes Absolute: 1.1 10*3/uL — ABNORMAL HIGH (ref 0.1–1.0)
Neutro Abs: 17.7 10*3/uL — ABNORMAL HIGH (ref 1.7–7.7)
Neutrophils Relative %: 89 %
Platelets: 256 10*3/uL (ref 150–400)
RBC: 4.02 MIL/uL (ref 3.87–5.11)
RDW: 12.8 % (ref 11.5–15.5)
WBC: 19.9 10*3/uL — ABNORMAL HIGH (ref 4.0–10.5)

## 2018-02-25 LAB — COMPREHENSIVE METABOLIC PANEL
ALBUMIN: 4.3 g/dL (ref 3.5–5.0)
ALT: 20 U/L (ref 14–54)
AST: 22 U/L (ref 15–41)
Alkaline Phosphatase: 43 U/L (ref 38–126)
Anion gap: 12 (ref 5–15)
BILIRUBIN TOTAL: 1.5 mg/dL — AB (ref 0.3–1.2)
BUN: 10 mg/dL (ref 6–20)
CO2: 23 mmol/L (ref 22–32)
Calcium: 9.4 mg/dL (ref 8.9–10.3)
Chloride: 101 mmol/L (ref 101–111)
Creatinine, Ser: 0.86 mg/dL (ref 0.44–1.00)
GFR calc Af Amer: >60 mL/min (ref >60)
GFR calc non Af Amer: >60 mL/min (ref >60)
GLUCOSE: 141 mg/dL — AB (ref 65–99)
Potassium: 3.8 mmol/L (ref 3.5–5.1)
Sodium: 136 mmol/L (ref 135–145)
Total Protein: 7.4 g/dL (ref 6.5–8.1)

## 2018-02-25 SURGERY — APPENDECTOMY, LAPAROSCOPIC
Anesthesia: General | Site: Abdomen

## 2018-02-25 MED ORDER — SUFENTANIL CITRATE 50 MCG/ML IV SOLN
INTRAVENOUS | Status: DC | PRN
  Administered 2018-02-25 (×2): 10 ug via INTRAVENOUS
  Administered 2018-02-25: 5 ug via INTRAVENOUS

## 2018-02-25 MED ORDER — GABAPENTIN 300 MG PO CAPS
300.0000 mg | ORAL_CAPSULE | Freq: Two times a day (BID) | ORAL | Status: DC
  Administered 2018-02-26 – 2018-02-27 (×4): 300 mg via ORAL
  Filled 2018-02-25 (×4): qty 1

## 2018-02-25 MED ORDER — DEXAMETHASONE SODIUM PHOSPHATE 10 MG/ML IJ SOLN
INTRAMUSCULAR | Status: AC
Start: 2018-02-25 — End: ?
  Filled 2018-02-25: qty 1

## 2018-02-25 MED ORDER — MIDAZOLAM HCL 5 MG/5ML IJ SOLN
INTRAMUSCULAR | Status: DC | PRN
  Administered 2018-02-25: 2 mg via INTRAVENOUS

## 2018-02-25 MED ORDER — MORPHINE SULFATE (PF) 4 MG/ML IV SOLN: 2.0000 mg | INTRAVENOUS | Status: DC | PRN

## 2018-02-25 MED ORDER — SUGAMMADEX SODIUM 200 MG/2ML IV SOLN
INTRAVENOUS | Status: AC
  Filled 2018-02-25: qty 2

## 2018-02-25 MED ORDER — SODIUM CHLORIDE 0.9 % IV SOLN
2.0000 g | INTRAVENOUS | Status: DC
  Administered 2018-02-26: 2 g via INTRAVENOUS
  Filled 2018-02-25 (×2): qty 20

## 2018-02-25 MED ORDER — MORPHINE SULFATE (PF) 4 MG/ML IV SOLN
4.0000 mg | Freq: Once | INTRAVENOUS | Status: AC
  Administered 2018-02-25: 4 mg via INTRAVENOUS
  Filled 2018-02-25: qty 1

## 2018-02-25 MED ORDER — METRONIDAZOLE IN NACL 5-0.79 MG/ML-% IV SOLN
500.0000 mg | Freq: Once | INTRAVENOUS | Status: AC
  Administered 2018-02-25: 500 mg via INTRAVENOUS
  Filled 2018-02-25: qty 100

## 2018-02-25 MED ORDER — CELECOXIB 200 MG PO CAPS
200.0000 mg | ORAL_CAPSULE | Freq: Two times a day (BID) | ORAL | Status: DC
  Administered 2018-02-26 – 2018-02-27 (×4): 200 mg via ORAL
  Filled 2018-02-25 (×4): qty 1

## 2018-02-25 MED ORDER — SODIUM CHLORIDE 0.9 % IV SOLN
INTRAVENOUS | Status: DC
  Administered 2018-02-26 – 2018-02-27 (×3): via INTRAVENOUS

## 2018-02-25 MED ORDER — FENTANYL CITRATE (PF) 100 MCG/2ML IJ SOLN
INTRAMUSCULAR | Status: AC
  Filled 2018-02-25: qty 2

## 2018-02-25 MED ORDER — ACETAMINOPHEN 325 MG PO TABS: 650.0000 mg | ORAL_TABLET | Freq: Four times a day (QID) | ORAL | Status: DC | PRN

## 2018-02-25 MED ORDER — PROPOFOL 10 MG/ML IV BOLUS
INTRAVENOUS | Status: AC
  Filled 2018-02-25: qty 20

## 2018-02-25 MED ORDER — 0.9 % SODIUM CHLORIDE (POUR BTL) OPTIME
TOPICAL | Status: DC | PRN
  Administered 2018-02-25: 1000 mL

## 2018-02-25 MED ORDER — FENTANYL CITRATE (PF) 100 MCG/2ML IJ SOLN
25.0000 ug | INTRAMUSCULAR | Status: DC | PRN
  Administered 2018-02-25: 25 ug via INTRAVENOUS

## 2018-02-25 MED ORDER — IOHEXOL 300 MG/ML  SOLN
100.0000 mL | Freq: Once | INTRAMUSCULAR | Status: AC | PRN
  Administered 2018-02-25: 100 mL via INTRAVENOUS

## 2018-02-25 MED ORDER — SUCCINYLCHOLINE CHLORIDE 200 MG/10ML IV SOSY
PREFILLED_SYRINGE | INTRAVENOUS | Status: AC
  Filled 2018-02-25: qty 10

## 2018-02-25 MED ORDER — LACTATED RINGERS IV SOLN
INTRAVENOUS | Status: DC | PRN
  Administered 2018-02-25 (×2): via INTRAVENOUS

## 2018-02-25 MED ORDER — ROCURONIUM BROMIDE 50 MG/5ML IV SOLN
INTRAVENOUS | Status: AC
  Filled 2018-02-25: qty 1

## 2018-02-25 MED ORDER — SODIUM CHLORIDE 0.9 % IV SOLN
2.0000 g | Freq: Once | INTRAVENOUS | Status: AC
  Administered 2018-02-25: 2 g via INTRAVENOUS
  Filled 2018-02-25: qty 20

## 2018-02-25 MED ORDER — GLYCOPYRROLATE 0.2 MG/ML IJ SOLN
INTRAMUSCULAR | Status: DC | PRN
  Administered 2018-02-25: 0.2 mg via INTRAVENOUS

## 2018-02-25 MED ORDER — OXYCODONE HCL 5 MG PO TABS: 5.0000 mg | ORAL_TABLET | Freq: Once | ORAL | Status: DC | PRN

## 2018-02-25 MED ORDER — DIPHENHYDRAMINE HCL 50 MG/ML IJ SOLN: 25.0000 mg | Freq: Four times a day (QID) | INTRAMUSCULAR | Status: DC | PRN

## 2018-02-25 MED ORDER — ROCURONIUM BROMIDE 100 MG/10ML IV SOLN
INTRAVENOUS | Status: DC | PRN
  Administered 2018-02-25 (×2): 20 mg via INTRAVENOUS

## 2018-02-25 MED ORDER — OXYCODONE HCL 5 MG PO TABS
5.0000 mg | ORAL_TABLET | ORAL | Status: DC | PRN
  Administered 2018-02-26: 5 mg via ORAL
  Filled 2018-02-25: qty 2

## 2018-02-25 MED ORDER — SUGAMMADEX SODIUM 200 MG/2ML IV SOLN
INTRAVENOUS | Status: DC | PRN
  Administered 2018-02-25: 200 mg via INTRAVENOUS

## 2018-02-25 MED ORDER — OXYCODONE HCL 5 MG/5ML PO SOLN: 5.0000 mg | Freq: Once | ORAL | Status: DC | PRN

## 2018-02-25 MED ORDER — ONDANSETRON 4 MG PO TBDP: 4.0000 mg | ORAL_TABLET | Freq: Four times a day (QID) | ORAL | Status: DC | PRN

## 2018-02-25 MED ORDER — SUCCINYLCHOLINE CHLORIDE 20 MG/ML IJ SOLN
INTRAMUSCULAR | Status: DC | PRN
  Administered 2018-02-25: 60 mg via INTRAVENOUS

## 2018-02-25 MED ORDER — TRAMADOL HCL 50 MG PO TABS: 50.0000 mg | ORAL_TABLET | Freq: Four times a day (QID) | ORAL | Status: DC | PRN

## 2018-02-25 MED ORDER — SODIUM CHLORIDE 0.9 % IR SOLN
Status: DC | PRN
  Administered 2018-02-25: 1000 mL

## 2018-02-25 MED ORDER — LIDOCAINE HCL (CARDIAC) PF 100 MG/5ML IV SOSY
PREFILLED_SYRINGE | INTRAVENOUS | Status: DC | PRN
  Administered 2018-02-25: 60 mg via INTRATRACHEAL

## 2018-02-25 MED ORDER — ONDANSETRON HCL 4 MG/2ML IJ SOLN
INTRAMUSCULAR | Status: DC | PRN
  Administered 2018-02-25: 4 mg via INTRAVENOUS

## 2018-02-25 MED ORDER — DIPHENHYDRAMINE HCL 25 MG PO CAPS: 25.0000 mg | ORAL_CAPSULE | Freq: Four times a day (QID) | ORAL | Status: DC | PRN

## 2018-02-25 MED ORDER — DEXAMETHASONE SODIUM PHOSPHATE 10 MG/ML IJ SOLN
INTRAMUSCULAR | Status: DC | PRN
  Administered 2018-02-25: 10 mg via INTRAVENOUS

## 2018-02-25 MED ORDER — ONDANSETRON HCL 4 MG/2ML IJ SOLN
4.0000 mg | Freq: Four times a day (QID) | INTRAMUSCULAR | Status: DC | PRN
Start: 2018-02-25 — End: 2018-02-27

## 2018-02-25 MED ORDER — PHENYLEPHRINE HCL 10 MG/ML IJ SOLN
INTRAMUSCULAR | Status: DC | PRN
  Administered 2018-02-25: 80 ug via INTRAVENOUS

## 2018-02-25 MED ORDER — SODIUM CHLORIDE 0.9 % IJ SOLN
INTRAMUSCULAR | Status: AC
  Filled 2018-02-25: qty 10

## 2018-02-25 MED ORDER — ONDANSETRON HCL 4 MG/2ML IJ SOLN
INTRAMUSCULAR | Status: AC
  Filled 2018-02-25: qty 2

## 2018-02-25 MED ORDER — LIDOCAINE 2% (20 MG/ML) 5 ML SYRINGE
INTRAMUSCULAR | Status: AC
Start: 2018-02-25 — End: ?
  Filled 2018-02-25: qty 5

## 2018-02-25 MED ORDER — SUFENTANIL CITRATE 50 MCG/ML IV SOLN
INTRAVENOUS | Status: AC
Start: 2018-02-25 — End: ?
  Filled 2018-02-25: qty 1

## 2018-02-25 MED ORDER — BUPIVACAINE-EPINEPHRINE 0.25% -1:200000 IJ SOLN
INTRAMUSCULAR | Status: AC
Start: 2018-02-25 — End: ?
  Filled 2018-02-25: qty 1

## 2018-02-25 MED ORDER — PROPOFOL 10 MG/ML IV BOLUS
INTRAVENOUS | Status: DC | PRN
  Administered 2018-02-25: 120 mg via INTRAVENOUS

## 2018-02-25 MED ORDER — ACETAMINOPHEN 650 MG RE SUPP: 650.0000 mg | Freq: Four times a day (QID) | RECTAL | Status: DC | PRN

## 2018-02-25 MED ORDER — BUPIVACAINE-EPINEPHRINE 0.25% -1:200000 IJ SOLN
INTRAMUSCULAR | Status: DC | PRN
  Administered 2018-02-25: 8 mL

## 2018-02-25 MED ORDER — MIDAZOLAM HCL 2 MG/2ML IJ SOLN
INTRAMUSCULAR | Status: AC
  Filled 2018-02-25: qty 2

## 2018-02-25 MED ORDER — ENOXAPARIN SODIUM 40 MG/0.4ML ~~LOC~~ SOLN
40.0000 mg | SUBCUTANEOUS | Status: DC
  Filled 2018-02-25 (×2): qty 0.4

## 2018-02-25 MED ORDER — METRONIDAZOLE IN NACL 5-0.79 MG/ML-% IV SOLN
500.0000 mg | Freq: Three times a day (TID) | INTRAVENOUS | Status: DC
  Administered 2018-02-26 – 2018-02-27 (×5): 500 mg via INTRAVENOUS
  Filled 2018-02-25 (×6): qty 100

## 2018-02-25 SURGICAL SUPPLY — 49 items
APPLIER CLIP ROT 10 11.4 M/L (STAPLE)
BENZOIN TINCTURE PRP APPL 2/3 (GAUZE/BANDAGES/DRESSINGS) ×3 IMPLANT
BLADE CLIPPER SURG (BLADE) IMPLANT
CANISTER SUCT 3000ML PPV (MISCELLANEOUS) ×3 IMPLANT
CHLORAPREP W/TINT 26ML (MISCELLANEOUS) ×3 IMPLANT
CLIP APPLIE ROT 10 11.4 M/L (STAPLE) IMPLANT
CLOSURE WOUND 1/2 X4 (GAUZE/BANDAGES/DRESSINGS) ×1
COVER SURGICAL LIGHT HANDLE (MISCELLANEOUS) ×3 IMPLANT
CUTTER FLEX LINEAR 45M (STAPLE) ×3 IMPLANT
DRSG TEGADERM 2-3/8X2-3/4 SM (GAUZE/BANDAGES/DRESSINGS) ×3 IMPLANT
DRSG TEGADERM 4X4.75 (GAUZE/BANDAGES/DRESSINGS) ×3 IMPLANT
ELECT REM PT RETURN 9FT ADLT (ELECTROSURGICAL) ×3
ELECTRODE REM PT RTRN 9FT ADLT (ELECTROSURGICAL) ×1 IMPLANT
ENDOLOOP SUT PDS II  0 18 (SUTURE)
ENDOLOOP SUT PDS II 0 18 (SUTURE) IMPLANT
FILTER SMOKE EVAC LAPAROSHD (FILTER) ×3 IMPLANT
GAUZE SPONGE 2X2 8PLY NS (GAUZE/BANDAGES/DRESSINGS) ×3 IMPLANT
GAUZE SPONGE 2X2 8PLY STRL LF (GAUZE/BANDAGES/DRESSINGS) ×1 IMPLANT
GLOVE BIO SURGEON STRL SZ7 (GLOVE) ×3 IMPLANT
GLOVE BIOGEL PI IND STRL 7.5 (GLOVE) ×1 IMPLANT
GLOVE BIOGEL PI INDICATOR 7.5 (GLOVE) ×2
GOWN STRL REUS W/ TWL LRG LVL3 (GOWN DISPOSABLE) ×3 IMPLANT
GOWN STRL REUS W/TWL LRG LVL3 (GOWN DISPOSABLE) ×6
KIT BASIN OR (CUSTOM PROCEDURE TRAY) ×3 IMPLANT
KIT TURNOVER KIT B (KITS) ×3 IMPLANT
NS IRRIG 1000ML POUR BTL (IV SOLUTION) ×3 IMPLANT
PAD ARMBOARD 7.5X6 YLW CONV (MISCELLANEOUS) ×6 IMPLANT
PENCIL BUTTON HOLSTER BLD 10FT (ELECTRODE) ×3 IMPLANT
POUCH SPECIMEN RETRIEVAL 10MM (ENDOMECHANICALS) ×3 IMPLANT
RELOAD STAPLE TA45 3.5 REG BLU (ENDOMECHANICALS) ×3 IMPLANT
SCISSORS ENDO CVD 5DCS (MISCELLANEOUS) IMPLANT
SCISSORS LAP 5X35 DISP (ENDOMECHANICALS) ×3 IMPLANT
SET IRRIG TUBING LAPAROSCOPIC (IRRIGATION / IRRIGATOR) ×3 IMPLANT
SHEARS HARMONIC ACE PLUS 36CM (ENDOMECHANICALS) ×3 IMPLANT
SLEEVE ENDOPATH XCEL 5M (ENDOMECHANICALS) ×3 IMPLANT
SPECIMEN JAR SMALL (MISCELLANEOUS) ×3 IMPLANT
SPONGE GAUZE 2X2 STER 10/PKG (GAUZE/BANDAGES/DRESSINGS) ×2
STRIP CLOSURE SKIN 1/2X4 (GAUZE/BANDAGES/DRESSINGS) ×2 IMPLANT
SUT MNCRL AB 4-0 PS2 18 (SUTURE) ×3 IMPLANT
SUT NOVA NAB GS-21 0 18 T12 DT (SUTURE) ×3 IMPLANT
SUT VIC AB 3-0 SH 27 (SUTURE) ×2
SUT VIC AB 3-0 SH 27XBRD (SUTURE) ×1 IMPLANT
TOWEL OR 17X24 6PK STRL BLUE (TOWEL DISPOSABLE) ×3 IMPLANT
TOWEL OR 17X26 10 PK STRL BLUE (TOWEL DISPOSABLE) ×3 IMPLANT
TRAY LAPAROSCOPIC MC (CUSTOM PROCEDURE TRAY) ×3 IMPLANT
TROCAR XCEL BLUNT TIP 100MML (ENDOMECHANICALS) ×3 IMPLANT
TROCAR XCEL NON-BLD 5MMX100MML (ENDOMECHANICALS) ×3 IMPLANT
TUBING INSUFFLATION (TUBING) ×3 IMPLANT
WATER STERILE IRR 1000ML POUR (IV SOLUTION) ×3 IMPLANT

## 2018-02-25 NOTE — ED Triage Notes (Signed)
Pt reports RLQ pain that started yesterday. She states she has had abd pain RLQ that started yesterday. She reports n/v. Denies fevers or urinary sx.

## 2018-02-25 NOTE — Op Note (Signed)
Appendectomy, Lap,  Repair of umbilical hernia Procedure Note  Indications: The patient presented with a history of right-sided abdominal pain. A CT scan revealed findings consistent with acute appendicitis.  She also has a chronic umbilical hernia  Pre-operative Diagnosis: Acute appendicitis without mention of peritonitis/ umbilical hernia  Post-operative Diagnosis: Same  Surgeon: Wynona Luna   Assistants: none  Anesthesia: General endotracheal anesthesia  ASA Class: 1E  Procedure Details  The patient was seen again in the Holding Room. The risks, benefits, complications, treatment options, and expected outcomes were discussed with the patient and/or family. The possibilities of reaction to medication, perforation of viscus, bleeding, recurrent infection, finding a normal appendix, the need for additional procedures, failure to diagnose a condition, and creating a complication requiring transfusion or operation were discussed. There was concurrence with the proposed plan and informed consent was obtained. The site of surgery was properly noted. The patient was taken to Operating Room, identified as Charlotte Giles and the procedure verified as Appendectomy. A Time Out was held and the above information confirmed.  The patient was placed in the supine position and general anesthesia was induced.  The abdomen was prepped and draped in a sterile fashion. A one centimeter infraumbilical incision was made.  Dissection was carried down to the fascia bluntly.  The fascia was incised vertically.  We entered the peritoneal cavity bluntly.  A pursestring suture was passed around the incision with a 0 Vicryl.  The Hasson cannula was introduced into the abdomen and the tails of the suture were used to hold the Hasson in place.   The pneumoperitoneum was then established maintaining a maximum pressure of 15 mmHg.  Additional 5 mm cannulas then placed in the left lower quadrant of the abdomen and  the right upper quadrant under direct visualization. A careful evaluation of the entire abdomen was carried out. There is some purulent free fluid in the pelvis and the right gutter.  The patient was placed in Trendelenburg and left lateral decubitus position.  The scope was moved to the right upper quadrant port site. The cecum was mobilized medially.  The appendix was very inflamed, but there was no sign of perforation or abscess.  The appendix was carefully dissected. The appendix was skeletonized with the harmonic scalpel.   The appendix was divided at its base using an endo-GIA stapler. Minimal appendiceal stump was left in place. There was no evidence of bleeding, leakage, or complication after division of the appendix. Irrigation was also performed and irrigate suctioned from the abdomen as well.  The ports were removed.  I dissected superiorly to repair the umbilical hernia.  The defect is about 7 mm, but we connected it to our original umbilical port site to make a single defect.  The fascia was closed with four figure-of-eight 0 Novofil sutures.  Some of the umbilical skin was ischemic, so it was debrided.  The wound was closed with 3-0 Vicryl and 4-0 Monocryl.  The trocar site skin wounds were closed with 4-0 Monocryl.  Instrument, sponge, and needle counts were correct at the conclusion of the case.   Findings: The appendix was found to be inflamed. There were not signs of necrosis.  There was not perforation. There was not abscess formation.  Estimated Blood Loss:  Minimal         Drains: none         Specimens: appendix         Complications:  None; patient tolerated the procedure  well.         Disposition: PACU - hemodynamically stable.         Condition: stable  Charlotte Giles. Charlotte Skains, MD, [facility]  General/ Trauma Surgery  02/25/2018 9:28 PM

## 2018-02-25 NOTE — H&P (Signed)
Charlotte Giles is an 32 y.o. female.   Chief Complaint:  RLQ pain HPI: 32 yo female in good health presents with RLQ pain since 5 PM on 02/24/18.  Vomiting.  No diarrhea.  No fever.  She presented to Urgent Care and was transferred to the ED for evaluation.  Acute appendicitis on CT scan.  Small umbilical hernia since pregnancy.  Past Medical History:  Diagnosis Date  . Acute blood loss anemia 12/12/2013  . Anemia   . 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 HSV infection     History reviewed. No pertinent surgical history.  Family History  Problem Relation Age of Onset  . Hypertension Mother   . Ovarian cancer Mother   . Cancer Mother   . Cancer Father 70       PROSTATE  . Diabetes Maternal Grandmother   . Hypertension Maternal Grandmother    Social History:  reports that she has never smoked. She has never used smokeless tobacco. She reports that she drinks alcohol. She reports that she does not use drugs.  Allergies: No Known Allergies  Medications Prior to Admission  Medication Sig Dispense Refill  . acetaminophen (TYLENOL) 500 MG tablet Take 500-1,000 mg by mouth every 8 (eight) hours as needed (for headaches).     Marland Kitchen ibuprofen (ADVIL,MOTRIN) 200 MG tablet Take 600 mg by mouth every 6 (six) hours as needed (for headaches).     . Prenatal Vit-Fe Fumarate-FA (PRENATAL MULTIVITAMIN) TABS tablet Take 1 tablet by mouth daily at 12 noon.    Marland Kitchen ibuprofen (ADVIL,MOTRIN) 600 MG tablet Take 1 tablet (600 mg total) by mouth every 6 (six) hours. (Patient not taking: Reported on 02/25/2018) 60 tablet 0    Results for orders placed or performed during the hospital encounter of 02/25/18 (from the past 48 hour(s))  CBC with Differential/Platelet     Status: Abnormal   Collection Time: 02/25/18  1:45 PM  Result Value Ref Range   WBC 19.9 (H) 4.0 - 10.5 K/uL   RBC 4.02 3.87 - 5.11 MIL/uL   Hemoglobin 11.8 (L) 12.0 - 15.0 g/dL   HCT 36.0 36.0 - 46.0 %   MCV 89.6 78.0 - 100.0 fL   MCH 29.4 26.0 - 34.0 pg   MCHC 32.8 30.0 - 36.0 g/dL   RDW 12.8 11.5 - 15.5 %   Platelets 256 150 - 400 K/uL   Neutrophils Relative % 89 %   Neutro Abs 17.7 (H) 1.7 - 7.7 K/uL   Lymphocytes Relative 5 %   Lymphs Abs 1.1 0.7 - 4.0 K/uL   Monocytes Relative 6 %   Monocytes Absolute 1.1 (H) 0.1 - 1.0 K/uL   Eosinophils Relative 0 %   Eosinophils Absolute 0.0 0.0 - 0.7 K/uL   Basophils Relative 0 %   Basophils Absolute 0.0 0.0 - 0.1 K/uL    Comment: Performed at [facility], [address], [place], [place] [address]  Comprehensive metabolic panel     Status: Abnormal   Collection Time: 02/25/18  1:45 PM  Result Value Ref Range   Sodium 136 135 - 145 mmol/L   Potassium 3.8 3.5 - 5.1 mmol/L   Chloride 101 101 - 111 mmol/L   CO2 23 22 - 32 mmol/L   Glucose, Bld 141 (H) 65 - 99 mg/dL   BUN 10 6 - 20 mg/dL   Creatinine, Ser 0.86 0.44 - 1.00 mg/dL   Calcium 9.4 8.9 - 10.3  mg/dL   Total Protein 7.4 6.5 - 8.1 g/dL   Albumin 4.3 3.5 - 5.0 g/dL   AST 22 15 - 41 U/L   ALT 20 14 - 54 U/L   Alkaline Phosphatase 43 38 - 126 U/L   Total Bilirubin 1.5 (H) 0.3 - 1.2 mg/dL   GFR calc non Af Amer >60 >60 mL/min   GFR calc Af Amer >60 >60 mL/min    Comment: (NOTE) The eGFR has been calculated using the CKD EPI equation. This calculation has not been validated in all clinical situations. eGFR's persistently <60 mL/min signify possible Chronic Kidney Disease.    Anion gap 12 5 - 15    Comment: Performed at [facility] [address], [place], [place] [address]  I-Stat Beta hCG blood, ED ([facility], [facility], [facility] only)     Status: None   Collection Time: 02/25/18  2:27 PM  Result Value Ref Range   I-stat hCG, quantitative <5.0 <5 mIU/mL   Comment 3            Comment:   GEST. AGE      CONC.  (mIU/mL)   <=1 WEEK        5 - 50     2 WEEKS       50 - 500     3 WEEKS       100 - 10,000     4 WEEKS     1,000 - 30,000        FEMALE AND NON-PREGNANT FEMALE:     LESS  THAN 5 mIU/mL    US Pelvis Transvanginal Non-ob (tv Only)  Result Date: 02/25/2018 CLINICAL DATA:  Acute pelvic pain. EXAM: TRANSABDOMINAL AND TRANSVAGINAL ULTRASOUND OF PELVIS DOPPLER ULTRASOUND OF OVARIES TECHNIQUE: Both transabdominal and transvaginal ultrasound examinations of the pelvis were performed. Transabdominal technique was performed for global imaging of the pelvis including uterus, ovaries, adnexal regions, and pelvic cul-de-sac. It was necessary to proceed with endovaginal exam following the transabdominal exam to visualize the endometrium and ovaries. Color and duplex Doppler ultrasound was utilized to evaluate blood flow to the ovaries. COMPARISON:  None. FINDINGS: Uterus Measurements: 7.0 x 4.5 x 3.0 cm. No fibroids or other mass visualized. Endometrium Thickness: 5.5 mm which is within normal limits. No focal abnormality visualized. Right ovary Measurements: 3.1 x 1.5 x 1.2 cm. Normal appearance/no adnexal mass. Left ovary Measurements: 3.8 x 2.5 x 1.4 cm. Normal appearance/no adnexal mass. Pulsed Doppler evaluation of both ovaries demonstrates normal low-resistance arterial and venous waveforms. Other findings Small amount of free fluid is noted which most likely is physiologic. IMPRESSION: No significant abnormality seen in the pelvis. Electronically Signed   By: Marijo Conception, M.D.   On: 02/25/2018 15:50   US Pelvis Complete  Result Date: 02/25/2018 CLINICAL DATA:  Acute pelvic pain. EXAM: TRANSABDOMINAL AND TRANSVAGINAL ULTRASOUND OF PELVIS DOPPLER ULTRASOUND OF OVARIES TECHNIQUE: Both transabdominal and transvaginal ultrasound examinations of the pelvis were performed. Transabdominal technique was performed for global imaging of the pelvis including uterus, ovaries, adnexal regions, and pelvic cul-de-sac. It was necessary to proceed with endovaginal exam following the transabdominal exam to visualize the endometrium and ovaries. Color and duplex Doppler ultrasound was utilized to  evaluate blood flow to the ovaries. COMPARISON:  None. FINDINGS: Uterus Measurements: 7.0 x 4.5 x 3.0 cm. No fibroids or other mass visualized. Endometrium Thickness: 5.5 mm which is within normal limits. No focal abnormality visualized. Right ovary Measurements: 3.1 x 1.5 x  1.2 cm. Normal appearance/no adnexal mass. Left ovary Measurements: 3.8 x 2.5 x 1.4 cm. Normal appearance/no adnexal mass. Pulsed Doppler evaluation of both ovaries demonstrates normal low-resistance arterial and venous waveforms. Other findings Small amount of free fluid is noted which most likely is physiologic. IMPRESSION: No significant abnormality seen in the pelvis. Electronically Signed   By: Marijo Conception, M.D.   On: 02/25/2018 15:50   Ct Abdomen Pelvis W Contrast  Result Date: 02/25/2018 CLINICAL DATA:  Right lower quadrant pain. EXAM: CT ABDOMEN AND PELVIS WITH CONTRAST TECHNIQUE: Multidetector CT imaging of the abdomen and pelvis was performed using the standard protocol following bolus administration of intravenous contrast. CONTRAST:  164m OMNIPAQUE IOHEXOL 300 MG/ML  SOLN COMPARISON:  None. FINDINGS: Lower chest: No acute abnormality. Hepatobiliary: There is a mass, likely arising from the central left hepatic lobe posteriorly as seen on axial image 9 and coronal image 26 measuring 3.9 x 3.0 x 5.4 cm. There is a small amount of peripheral enhancement suggested. The remainder of the liver is normal. The gallbladder is otherwise normal. Pancreas: Unremarkable. No pancreatic ductal dilatation or surrounding inflammatory changes. Spleen: Normal in size without focal abnormality. Adrenals/Urinary Tract: Adrenal glands are unremarkable. Kidneys are normal, without renal calculi, focal lesion, or hydronephrosis. Bladder is unremarkable. Stomach/Bowel: The stomach is normal in appearance. The small bowel is normal. The colon is unremarkable. The appendix is markedly abnormal with a proximal appendicolith, dilatation, and adjacent  stranding. No definitive adjacent fluid collections or extraluminal air. Vascular/Lymphatic: No significant vascular findings are present. No enlarged abdominal or pelvic lymph nodes. Reproductive: Uterus and bilateral adnexa are unremarkable. Other: Free fluid in the pelvis could be reactive to the appendicitis versus vis the logic and a female patient of this age. There is an umbilical hernia containing a small loop of transverse colon without obstruction. Musculoskeletal: No acute or significant osseous findings. IMPRESSION: 1. Appendicitis. Appendix: Location: Originates from the cecum in the right side of the pelvis and extends just across midline to the left. Diameter: 15 mm Appendicolith: Yes Mucosal hyper-enhancement: No Extraluminal gas: No Periappendiceal collection: No 2. There is a mass in the upper abdomen thought to arise from the left hepatic lobe. 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 Recommend an MRI as an outpatient for further evaluation. 3. Umbilical hernia. Electronically Signed   By: DDorise BullionIII M.D   On: 02/25/2018 17:46   [place]Pelvic Doppler (torsion R/o Or Mass Arterial Flow)  Result Date: 02/25/2018 CLINICAL DATA:  Acute pelvic pain. EXAM: TRANSABDOMINAL AND TRANSVAGINAL ULTRASOUND OF PELVIS DOPPLER ULTRASOUND OF OVARIES TECHNIQUE: Both transabdominal and transvaginal ultrasound examinations of the pelvis were performed. Transabdominal technique was performed for global imaging of the pelvis including uterus, ovaries, adnexal regions, and pelvic cul-de-sac. It was necessary to proceed with endovaginal exam following the transabdominal exam to visualize the endometrium and ovaries. Color and duplex Doppler ultrasound was utilized to evaluate blood flow to the ovaries. COMPARISON:  None. FINDINGS: Uterus Measurements: 7.0 x 4.5 x 3.0 cm. No fibroids or other mass visualized. Endometrium Thickness: 5.5 mm which is within normal limits. No  focal abnormality visualized. Right ovary Measurements: 3.1 x 1.5 x 1.2 cm. Normal appearance/no adnexal mass. Left ovary Measurements: 3.8 x 2.5 x 1.4 cm. Normal appearance/no adnexal mass. Pulsed Doppler evaluation of both ovaries demonstrates normal low-resistance arterial and venous waveforms. Other findings Small amount of free fluid is noted which most likely is physiologic. IMPRESSION:  No significant abnormality seen in the pelvis. Electronically Signed   By: Marijo Conception, M.D.   On: 02/25/2018 15:50    Review of Systems  Constitutional: Negative for weight loss.  HENT: Negative for ear discharge, ear pain, hearing loss and tinnitus.   Eyes: Negative for blurred vision, double vision, photophobia and pain.  Respiratory: Negative for cough, sputum production and shortness of breath.   Cardiovascular: Negative for chest pain.  Gastrointestinal: Positive for abdominal pain, nausea and vomiting.  Genitourinary: Negative for dysuria, flank pain, frequency and urgency.  Musculoskeletal: Negative for back pain, falls, joint pain, myalgias and neck pain.  Neurological: Negative for dizziness, tingling, sensory change, focal weakness, loss of consciousness and headaches.  Endo/Heme/Allergies: Does not bruise/bleed easily.  Psychiatric/Behavioral: Negative for depression, memory loss and substance abuse. The patient is not nervous/anxious.     Blood pressure 116/78, pulse (!) 107, temperature 99.1 F (37.3 C), temperature source Oral, resp. rate 18, height _0  (1.549 m), weight 52.2 kg (115 lb), last menstrual period 02/12/2018, SpO2 100 %, unknown if currently breastfeeding. Physical Exam  WDWN in NAD Eyes:  Pupils equal, round; sclera anicteric HENT:  Oral mucosa moist; good dentition  Neck:  No masses palpated, no thyromegaly Lungs:  CTA bilaterally; normal respiratory effort CV:  Regular rate and rhythm; no murmurs; extremities well-perfused with no edema Abd:  +bowel sounds, tender  in RLQ; small reducible umbilical hernia Skin:  Warm, dry; no sign of jaundice Psychiatric - alert and oriented x 4; calm mood and affect  Assessment/Plan Acute appendicitis - no sign of perforation Incidental umbilical hernia  Laparoscopic appendectomy.  The surgical procedure has been discussed with the patient.  Potential risks, benefits, alternative treatments, and expected outcomes have been explained.  All of the patient's questions at this time have been answered.  The likelihood of reaching the patient's treatment goal is good.  The patient understand the proposed surgical procedure and wishes to proceed.   Maia Petties, MD 02/25/2018, 7:57 PM

## 2018-02-25 NOTE — Anesthesia Procedure Notes (Signed)
Procedure Name: Intubation Date/Time: 02/25/2018 8:21 PM Performed by: Claris Che, CRNA Pre-anesthesia Checklist: Patient identified, Emergency Drugs available, Suction available, Patient being monitored and Timeout performed Patient Re-evaluated:Patient Re-evaluated prior to induction Oxygen Delivery Method: Circle system utilized Preoxygenation: Pre-oxygenation with 100% oxygen Induction Type: IV induction, Rapid sequence and Cricoid Pressure applied Laryngoscope Size: Mac and 3 Grade View: Grade II Tube size: 7.5 mm Number of attempts: 1 Airway Equipment and Method: Stylet Placement Confirmation: ETT inserted through vocal cords under direct vision,  positive ETCO2 and breath sounds checked- equal and bilateral Secured at: 22 cm Tube secured with: Tape Dental Injury: Teeth and Oropharynx as per pre-operative assessment

## 2018-02-25 NOTE — ED Triage Notes (Signed)
Pt here for mid to RLQ pain starting last night with some vomiting

## 2018-02-25 NOTE — ED Provider Notes (Signed)
[facility] EMERGENCY DEPARTMENT Provider Note   CSN: [id number] Arrival date & time: 02/25/18  1147     History   Chief Complaint Chief Complaint  Patient presents with  . Abdominal Pain    HPI Charlotte Giles is a 32 y.o. female.  HPI   32 year old female presents today with complaints of right lower quadrant abdominal pain.  Patient notes symptoms started last night around 5 PM with acute onset of sharp pain.  She notes associated vomiting.  She notes the symptoms have continued to persist.  She was seen in urgent care and referred to the emergency room.  She denies any prior abdominal surgeries, LMP 02/12/2018.  She denies any vaginal discharge bleeding, dysuria or abnormal bowel movements.   Past Medical History:  Diagnosis Date  . Acute blood loss anemia 12/12/2013  . Anemia   . 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 HSV infection     Patient Active Problem List   Diagnosis Date Noted  . Family history of ovarian cancer 01/15/2017    History reviewed. No pertinent surgical history.   OB History    Gravida  2   Para  2   Term  2   Preterm      AB      Living  2     SAB      TAB      Ectopic      Multiple  0   Live Births  2            Home Medications    Prior to Admission medications   Medication Sig Start Date End Date Taking? Authorizing Provider  acetaminophen (TYLENOL) 500 MG tablet Take 500-1,000 mg by mouth every 8 (eight) hours as needed (for headaches).    Yes [provider]  ibuprofen (ADVIL,MOTRIN) 200 MG tablet Take 600 mg by mouth every 6 (six) hours as needed (for headaches).    Yes [provider]  Prenatal Vit-Fe Fumarate-FA (PRENATAL MULTIVITAMIN) TABS tablet Take 1 tablet by mouth daily at 12 noon.   Yes [provider]  ibuprofen (ADVIL,MOTRIN) 600 MG tablet Take 1 tablet (600 mg total) by mouth every 6 (six) hours. Patient not  taking: Reported on 02/25/2018 01/15/16   Noland Fordyce, MD    Family History Family History  Problem Relation Age of Onset  . Hypertension Mother   . Ovarian cancer Mother   . Cancer Mother   . Cancer Father 72       PROSTATE  . Diabetes Maternal Grandmother   . Hypertension Maternal Grandmother     Social History Social History   Tobacco Use  . Smoking status: Never Smoker  . Smokeless tobacco: Never Used  Substance Use Topics  . Alcohol use: Yes    Alcohol/week: 0.0 oz    Comment: social   . Drug use: No     Allergies   Patient has no known allergies.   Review of Systems Review of Systems  All other systems reviewed and are negative.    Physical Exam Updated Vital Signs BP 102/74 (BP Location: Left Arm)   Pulse (!) 133   Temp 98.8 F (37.1 C)   Resp 14   Ht  (1.549 m)   Wt 52.2 kg (115 lb)   LMP 02/12/2018 (Within Days)   SpO2 100%   BMI 21.73 kg/m   Physical Exam  Constitutional: She is oriented to  person, place, and time. She appears well-developed and well-nourished.  HENT:  Head: Normocephalic and atraumatic.  Eyes: Pupils are equal, round, and reactive to light. Conjunctivae are normal. Right eye exhibits no discharge. Left eye exhibits no discharge. No scleral icterus.  Neck: Normal range of motion. No JVD present. No tracheal deviation present.  Pulmonary/Chest: Effort normal. No stridor.  Abdominal:  Exquisite tenderness palpation right lower quadrant remainder of abdominal exam nonfocal nontender  Neurological: She is alert and oriented to person, place, and time. Coordination normal.  Psychiatric: She has a normal mood and affect. Her behavior is normal. Judgment and thought content normal.  Nursing note and vitals reviewed.    ED Treatments / Results  Labs (all labs ordered are listed, but only abnormal results are displayed) Labs Reviewed  CBC WITH DIFFERENTIAL/PLATELET - Abnormal; Notable for the following components:       Result Value   WBC 19.9 (*)    Hemoglobin 11.8 (*)    Neutro Abs 17.7 (*)    Monocytes Absolute 1.1 (*)    All other components within normal limits  COMPREHENSIVE METABOLIC PANEL - Abnormal; Notable for the following components:   Glucose, Bld 141 (*)    Total Bilirubin 1.5 (*)    All other components within normal limits  URINALYSIS, ROUTINE W REFLEX MICROSCOPIC  I-STAT BETA HCG BLOOD, ED ([facility], [facility], [facility] ONLY)  GC/CHLAMYDIA PROBE AMP ([facility]) NOT AT [facility]  SURGICAL PATHOLOGY    EKG None  Radiology US Pelvis Transvanginal Non-ob (tv Only)  Result Date: 02/25/2018 CLINICAL DATA:  Acute pelvic pain. EXAM: TRANSABDOMINAL AND TRANSVAGINAL ULTRASOUND OF PELVIS DOPPLER ULTRASOUND OF OVARIES TECHNIQUE: Both transabdominal and transvaginal ultrasound examinations of the pelvis were performed. Transabdominal technique was performed for global imaging of the pelvis including uterus, ovaries, adnexal regions, and pelvic cul-de-sac. It was necessary to proceed with endovaginal exam following the transabdominal exam to visualize the endometrium and ovaries. Color and duplex Doppler ultrasound was utilized to evaluate blood flow to the ovaries. COMPARISON:  None. FINDINGS: Uterus Measurements: 7.0 x 4.5 x 3.0 cm. No fibroids or other mass visualized. Endometrium Thickness: 5.5 mm which is within normal limits. No focal abnormality visualized. Right ovary Measurements: 3.1 x 1.5 x 1.2 cm. Normal appearance/no adnexal mass. Left ovary Measurements: 3.8 x 2.5 x 1.4 cm. Normal appearance/no adnexal mass. Pulsed Doppler evaluation of both ovaries demonstrates normal low-resistance arterial and venous waveforms. Other findings Small amount of free fluid is noted which most likely is physiologic. IMPRESSION: No significant abnormality seen in the pelvis. Electronically Signed   By: Lupita Raider, M.D.   On: 02/25/2018 15:50   US Pelvis Complete  Result Date: 02/25/2018 CLINICAL DATA:  Acute pelvic pain. EXAM:  TRANSABDOMINAL AND TRANSVAGINAL ULTRASOUND OF PELVIS DOPPLER ULTRASOUND OF OVARIES TECHNIQUE: Both transabdominal and transvaginal ultrasound examinations of the pelvis were performed. Transabdominal technique was performed for global imaging of the pelvis including uterus, ovaries, adnexal regions, and pelvic cul-de-sac. It was necessary to proceed with endovaginal exam following the transabdominal exam to visualize the endometrium and ovaries. Color and duplex Doppler ultrasound was utilized to evaluate blood flow to the ovaries. COMPARISON:  None. FINDINGS: Uterus Measurements: 7.0 x 4.5 x 3.0 cm. No fibroids or other mass visualized. Endometrium Thickness: 5.5 mm which is within normal limits. No focal abnormality visualized. Right ovary Measurements: 3.1 x 1.5 x 1.2 cm. Normal appearance/no adnexal mass. Left ovary Measurements: 3.8 x 2.5 x 1.4 cm. Normal appearance/no adnexal mass. Pulsed  Doppler evaluation of both ovaries demonstrates normal low-resistance arterial and venous waveforms. Other findings Small amount of free fluid is noted which most likely is physiologic. IMPRESSION: No significant abnormality seen in the pelvis. Electronically Signed   By: Lupita Raider, M.D.   On: 02/25/2018 15:50   Ct Abdomen Pelvis W Contrast  Result Date: 02/25/2018 CLINICAL DATA:  Right lower quadrant pain. EXAM: CT ABDOMEN AND PELVIS WITH CONTRAST TECHNIQUE: Multidetector CT imaging of the abdomen and pelvis was performed using the standard protocol following bolus administration of intravenous contrast. CONTRAST:  OMNIPAQUE IOHEXOL 300 MG/ML  SOLN COMPARISON:  None. FINDINGS: Lower chest: No acute abnormality. Hepatobiliary: There is a mass, likely arising from the central left hepatic lobe posteriorly as seen on axial image 9 and coronal image 26 measuring 3.9 x 3.0 x 5.4 cm. There is a small amount of peripheral enhancement suggested. The remainder of the liver is normal. The gallbladder is otherwise  normal. Pancreas: Unremarkable. No pancreatic ductal dilatation or surrounding inflammatory changes. Spleen: Normal in size without focal abnormality. Adrenals/Urinary Tract: Adrenal glands are unremarkable. Kidneys are normal, without renal calculi, focal lesion, or hydronephrosis. Bladder is unremarkable. Stomach/Bowel: The stomach is normal in appearance. The small bowel is normal. The colon is unremarkable. The appendix is markedly abnormal with a proximal appendicolith, dilatation, and adjacent stranding. No definitive adjacent fluid collections or extraluminal air. Vascular/Lymphatic: No significant vascular findings are present. No enlarged abdominal or pelvic lymph nodes. Reproductive: Uterus and bilateral adnexa are unremarkable. Other: Free fluid in the pelvis could be reactive to the appendicitis versus vis the logic and a female patient of this age. There is an umbilical hernia containing a small loop of transverse colon without obstruction. Musculoskeletal: No acute or significant osseous findings. IMPRESSION: 1. Appendicitis. Appendix: Location: Originates from the cecum in the right side of the pelvis and extends just across midline to the left. Diameter: 15 mm Appendicolith: Yes Mucosal hyper-enhancement: No Extraluminal gas: No Periappendiceal collection: No 2. There is a mass in the upper abdomen thought to arise from the left hepatic lobe. 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 Recommend an MRI as an outpatient for further evaluation. 3. Umbilical hernia. Electronically Signed   By: Gerome Sam III M.D   On: 02/25/2018 17:46   US Pelvic Doppler (torsion R/o Or Mass Arterial Flow)  Result Date: 02/25/2018 CLINICAL DATA:  Acute pelvic pain. EXAM: TRANSABDOMINAL AND TRANSVAGINAL ULTRASOUND OF PELVIS DOPPLER ULTRASOUND OF OVARIES TECHNIQUE: Both transabdominal and transvaginal ultrasound examinations of the pelvis were performed. Transabdominal  technique was performed for global imaging of the pelvis including uterus, ovaries, adnexal regions, and pelvic cul-de-sac. It was necessary to proceed with endovaginal exam following the transabdominal exam to visualize the endometrium and ovaries. Color and duplex Doppler ultrasound was utilized to evaluate blood flow to the ovaries. COMPARISON:  None. FINDINGS: Uterus Measurements: 7.0 x 4.5 x 3.0 cm. No fibroids or other mass visualized. Endometrium Thickness: 5.5 mm which is within normal limits. No focal abnormality visualized. Right ovary Measurements: 3.1 x 1.5 x 1.2 cm. Normal appearance/no adnexal mass. Left ovary Measurements: 3.8 x 2.5 x 1.4 cm. Normal appearance/no adnexal mass. Pulsed Doppler evaluation of both ovaries demonstrates normal low-resistance arterial and venous waveforms. Other findings Small amount of free fluid is noted which most likely is physiologic. IMPRESSION: No significant abnormality seen in the pelvis. Electronically Signed   By: Lupita Raider, M.D.   On:  02/25/2018 15:50    Procedures Procedures (including critical care time)  Medications Ordered in ED Medications  fentaNYL (SUBLIMAZE) injection 25-50 mcg (25 mcg Intravenous Given 02/25/18 2140)  oxyCODONE (Oxy IR/ROXICODONE) immediate release tablet 5 mg (has no administration in time range)    Or  oxyCODONE (ROXICODONE) 5 MG/5ML solution 5 mg (has no administration in time range)  fentaNYL (SUBLIMAZE) 100 MCG/2ML injection (has no administration in time range)  morphine 4 MG/ML injection 4 mg (4 mg Intravenous Given 02/25/18 1617)  iohexol (OMNIPAQUE) 300 MG/ML solution 100 mL (100 mLs Intravenous Contrast Given 02/25/18 1658)  cefTRIAXone (ROCEPHIN) 2 g in sodium chloride 0.9 % 100 mL IVPB (0 g Intravenous Stopped 02/25/18 1845)    And  metroNIDAZOLE (FLAGYL) IVPB 500 mg (500 mg Intravenous New Bag/Given 02/25/18 1845)  morphine 4 MG/ML injection 4 mg (4 mg Intravenous Given 02/25/18 1844)     Initial Impression /  Assessment and Plan / ED Course  I have reviewed the triage vital signs and the nursing notes.  Pertinent labs & imaging results that were available during my care of the patient were reviewed by me and considered in my medical decision making (see chart for details).     Labs: CBC, CMP,  i-STAT beta-hCG  Imaging: CT abdomen pelvis with contrast and pelvic ultrasound  Consults: General surgery  Therapeutics: Ceftriaxone, metronidazole  Discharge Meds:   Assessment/Plan: 32 year old female presents today with acute appendicitis.  This appears to be uncomplicated without signs of perforation or abscess.  Patient started on antibiotics, general surgery consulted to take patient to the OR.   Final Clinical Impressions(s) / ED Diagnoses   Final diagnoses:  Acute pelvic pain, female    ED Discharge Orders    None       Rosalio Loud 02/25/18 2154    Tegeler, Canary Brim, MD 02/25/18 [phone]

## 2018-02-25 NOTE — ED Notes (Signed)
Pt in ultrasound

## 2018-02-25 NOTE — Transfer of Care (Signed)
Immediate Anesthesia Transfer of Care Note  Patient: Charlotte Giles  Procedure(s) Performed: APPENDECTOMY LAPAROSCOPIC (N/A Abdomen)  Patient Location: PACU  Anesthesia Type:General  Level of Consciousness: awake, alert , oriented and patient cooperative  Airway & Oxygen Therapy: Patient Spontanous Breathing and Patient connected to nasal cannula oxygen  Post-op Assessment: Report given to RN, Post -op Vital signs reviewed and stable and Patient moving all extremities X 4  Post vital signs: Reviewed and stable  Last Vitals:  Vitals Value Taken Time  BP 102/74 02/25/2018  9:33 PM  Temp    Pulse 120 02/25/2018  9:36 PM  Resp 20 02/25/2018  9:36 PM  SpO2 100 % 02/25/2018  9:36 PM  Vitals shown include unvalidated device data.  Last Pain:  Vitals:   02/25/18 1843  TempSrc:   PainSc: 8          Complications: No apparent anesthesia complications

## 2018-02-25 NOTE — ED Notes (Signed)
Pt sent to ED for further eval

## 2018-02-25 NOTE — ED Notes (Signed)
Pt in CT.

## 2018-02-25 NOTE — Progress Notes (Signed)
Marita Kansas, CRNA at bedside.

## 2018-02-25 NOTE — ED Provider Notes (Signed)
Patient placed in Quick Look pathway, seen and evaluated   Chief Complaint: Abdominal pain/pelvic pain  HPI:   Patient began with right lower quadrant abdominal pain/pelvic pain with radiation to the umbilical area last night about 5 PM.  Patient states that the pain was severe at onset.  She had associated vomiting.  She tried to sleep but symptoms did not improve.  She went to urgent care and was immediately told to go to the emergency department.  No previous surgeries.  She does not think that she is pregnant.  Last menstrual period 02/12/2018.  No urinary symptoms.  No diarrhea.  ROS: + pelvic pain, + vomiting  Physical Exam:   Gen: Uncomfortable appearing  Neuro: Awake and Alert  Skin: Warm    Focused Exam: Gen NAD; Heart mild tachy, nml S1,S2, no m/r/g; Lungs CTAB; Abd moderate to severe tenderness umbilical area to RLQ, + guarding; Ext 2+ pedal pulses bilaterally, no edema.  BP 114/80 (BP Location: Right Arm)   Pulse (!) 109   Temp 99.9 F (37.7 C) (Oral)   Resp 18   LMP 02/12/2018 (Within Days)   SpO2 100%   Lab work and imaging ordered.  Given abrupt onset of lower quadrant/pelvic pain, feel that torsion needs to be ruled out as well as ectopic pregnancy.  Appendicitis is also on the differential and patient may need CT if initial work-up negative.  Initiation of care has begun. The patient has been counseled on the process, plan, and necessity for staying for the completion/evaluation, and the remainder of the medical screening examination    Renne Crigler, PA-C 02/25/18 1335    Charlynne Pander, MD 02/25/18 [phone]

## 2018-02-25 NOTE — Anesthesia Preprocedure Evaluation (Signed)
Anesthesia Evaluation  Patient identified by MRN, date of birth, ID band Patient awake    Reviewed: Allergy & Precautions, H&P , NPO status , Patient's Chart, lab work & pertinent test results  History of Anesthesia Complications Negative for: history of anesthetic complications  Airway Mallampati: I  TM Distance: >3 FB Neck ROM: full    Dental  (+) Teeth Intact   Pulmonary neg pulmonary ROS,    breath sounds clear to auscultation       Cardiovascular negative cardio ROS   Rhythm:Regular     Neuro/Psych negative neurological ROS  negative psych ROS   GI/Hepatic Neg liver ROS, appy   Endo/Other  negative endocrine ROS  Renal/GU negative Renal ROS     Musculoskeletal   Abdominal Normal abdominal exam  (+)   Peds  Hematology negative hematology ROS (+)   Anesthesia Other Findings   Reproductive/Obstetrics                             Anesthesia Physical Anesthesia Plan  ASA: I  Anesthesia Plan: General   Post-op Pain Management:    Induction: Intravenous  PONV Risk Score and Plan: 3 and Ondansetron and Dexamethasone  Airway Management Planned: Oral ETT  Additional Equipment: None  Intra-op Plan:   Post-operative Plan: Extubation in OR  Informed Consent: I have reviewed the patients History and Physical, chart, labs and discussed the procedure including the risks, benefits and alternatives for the proposed anesthesia with the patient or authorized representative who has indicated his/her understanding and acceptance.   Dental advisory given  Plan Discussed with: CRNA and Surgeon  Anesthesia Plan Comments:         Anesthesia Quick Evaluation

## 2018-02-26 ENCOUNTER — Encounter (HOSPITAL_COMMUNITY): Payer: Self-pay | Admitting: Surgery

## 2018-02-26 ENCOUNTER — Other Ambulatory Visit: Payer: Self-pay

## 2018-02-26 LAB — BASIC METABOLIC PANEL
ANION GAP: 8 (ref 5–15)
BUN: 7 mg/dL (ref 6–20)
CO2: 26 mmol/L (ref 22–32)
Calcium: 8.2 mg/dL — ABNORMAL LOW (ref 8.9–10.3)
Chloride: 105 mmol/L (ref 101–111)
Creatinine, Ser: 0.98 mg/dL (ref 0.44–1.00)
GFR calc non Af Amer: >60 mL/min (ref >60)
Glucose, Bld: 162 mg/dL — ABNORMAL HIGH (ref 65–99)
POTASSIUM: 3.6 mmol/L (ref 3.5–5.1)
SODIUM: 139 mmol/L (ref 135–145)

## 2018-02-26 LAB — CBC
HEMATOCRIT: 33.1 % — AB (ref 36.0–46.0)
HEMOGLOBIN: 11.1 g/dL — AB (ref 12.0–15.0)
MCH: 29.9 pg (ref 26.0–34.0)
MCHC: 33.5 g/dL (ref 30.0–36.0)
MCV: 89.2 fL (ref 78.0–100.0)
Platelets: 231 10*3/uL (ref 150–400)
RBC: 3.71 MIL/uL — AB (ref 3.87–5.11)
RDW: 13.1 % (ref 11.5–15.5)
WBC: 9.3 10*3/uL (ref 4.0–10.5)

## 2018-02-26 MED ORDER — OXYCODONE HCL 5 MG PO TABS: 5.0000 mg | ORAL_TABLET | Freq: Four times a day (QID) | ORAL | 0 refills | Status: DC | PRN

## 2018-02-26 MED ORDER — OXYCODONE HCL 5 MG PO TABS
5.0000 mg | ORAL_TABLET | ORAL | Status: DC | PRN
  Administered 2018-02-26 (×2): 5 mg via ORAL
  Filled 2018-02-26 (×2): qty 1

## 2018-02-26 MED ORDER — MORPHINE SULFATE (PF) 4 MG/ML IV SOLN: 2.0000 mg | INTRAVENOUS | Status: DC | PRN

## 2018-02-26 NOTE — Discharge Instructions (Signed)

## 2018-02-26 NOTE — Progress Notes (Signed)
[facility] Progress Note  1 Day Post-Op  Subjective: CC- lap appy Overall doing well. Patient states that her abdomen is sore but pain well controlled. She is only taking oral pain medications. No flatus or BM. Ate a small amount of breakfast this morning without n/v, but states that she just does not have much of an appetite. Urinating without any issues. WBC WNL, TMAX 99.5 since surgery.  Objective: Vital signs in last 24 hours: Temp:  [98.6 F (37 C)-99.9 F (37.7 C)] 99.5 F (37.5 C) (05/08 0450) Pulse Rate:  [74-133] 74 (05/08 0450) Resp:  [12-19] 18 (05/08 0450) BP: (96-116)/(65-80) 97/68 (05/08 0450) SpO2:  [98 %-100 %] 100 % (05/08 0450) Weight:  [115 lb (52.2 kg)] 115 lb (52.2 kg) (05/07 1924) Last BM Date: 02/25/18  Intake/Output from previous day: 05/07 0701 - 05/08 0700 In: 2276.3 [P.O.:460; I.V.:1616.3; IV Piggyback:200] Out: 355 [Urine:350; Blood:5] Intake/Output this shift: No intake/output data recorded.  PE: Gen:  Alert, NAD, pleasant HEENT: EOM's intact, pupils equal and round Card:  RRR, no M/G/R heard Pulm:  CTAB, no W/R/R, effort normal Abd: Soft, mild distension/fullness right hemiabdomen, +BS, lap incisions with C/D/I dressings in place  Lab Results:  Recent Labs    02/25/18 1345 02/26/18 0417  WBC 19.9* 9.3  HGB 11.8* 11.1*  HCT 36.0 33.1*  PLT 256 231   BMET Recent Labs    02/25/18 1345 02/26/18 0417  NA 136 139  K 3.8 3.6  CL 101 105  CO2 23 26  GLUCOSE 141* 162*  BUN 10 7  CREATININE 0.86 0.98  CALCIUM 9.4 8.2*   PT/INR No results for input(s): LABPROT, INR in the last 72 hours. CMP     Component Value Date/Time   NA 139 02/26/2018 0417   K 3.6 02/26/2018 0417   CL 105 02/26/2018 0417   CO2 26 02/26/2018 0417   GLUCOSE 162 (H) 02/26/2018 0417   BUN 7 02/26/2018 0417   CREATININE 0.98 02/26/2018 0417   CREATININE 0.81 01/15/2017 1431   CALCIUM 8.2 (L) 02/26/2018 0417   PROT 7.4 02/25/2018 1345   ALBUMIN 4.3 02/25/2018 1345   AST 22 02/25/2018 1345   ALT 20 02/25/2018 1345   ALKPHOS 43 02/25/2018 1345   BILITOT 1.5 (H) 02/25/2018 1345   GFRNONAA >60 02/26/2018 0417   GFRAA >60 02/26/2018 0417   Lipase  No results found for: LIPASE     Studies/Results: US Pelvis Transvanginal Non-ob (tv Only)  Result Date: 02/25/2018 CLINICAL DATA:  Acute pelvic pain. EXAM: TRANSABDOMINAL AND TRANSVAGINAL ULTRASOUND OF PELVIS DOPPLER ULTRASOUND OF OVARIES TECHNIQUE: Both transabdominal and transvaginal ultrasound examinations of the pelvis were performed. Transabdominal technique was performed for global imaging of the pelvis including uterus, ovaries, adnexal regions, and pelvic cul-de-sac. It was necessary to proceed with endovaginal exam following the transabdominal exam to visualize the endometrium and ovaries. Color and duplex Doppler ultrasound was utilized to evaluate blood flow to the ovaries. COMPARISON:  None. FINDINGS: Uterus Measurements: 7.0 x 4.5 x 3.0 cm. No fibroids or other mass visualized. Endometrium Thickness: 5.5 mm which is within normal limits. No focal abnormality visualized. Right ovary Measurements: 3.1 x 1.5 x 1.2 cm. Normal appearance/no adnexal mass. Left ovary Measurements: 3.8 x 2.5 x 1.4 cm. Normal appearance/no adnexal mass. Pulsed Doppler evaluation of both ovaries demonstrates normal low-resistance arterial and venous waveforms. Other findings Small amount of free fluid is noted which most likely is physiologic. IMPRESSION: No significant abnormality seen in the pelvis.  Electronically Signed   By: Lupita Raider, M.D.   On: 02/25/2018 15:50   US Pelvis Complete  Result Date: 02/25/2018 CLINICAL DATA:  Acute pelvic pain. EXAM: TRANSABDOMINAL AND TRANSVAGINAL ULTRASOUND OF PELVIS DOPPLER ULTRASOUND OF OVARIES TECHNIQUE: Both transabdominal and transvaginal ultrasound examinations of the pelvis were performed. Transabdominal technique was performed for global imaging of the  pelvis including uterus, ovaries, adnexal regions, and pelvic cul-de-sac. It was necessary to proceed with endovaginal exam following the transabdominal exam to visualize the endometrium and ovaries. Color and duplex Doppler ultrasound was utilized to evaluate blood flow to the ovaries. COMPARISON:  None. FINDINGS: Uterus Measurements: 7.0 x 4.5 x 3.0 cm. No fibroids or other mass visualized. Endometrium Thickness: 5.5 mm which is within normal limits. No focal abnormality visualized. Right ovary Measurements: 3.1 x 1.5 x 1.2 cm. Normal appearance/no adnexal mass. Left ovary Measurements: 3.8 x 2.5 x 1.4 cm. Normal appearance/no adnexal mass. Pulsed Doppler evaluation of both ovaries demonstrates normal low-resistance arterial and venous waveforms. Other findings Small amount of free fluid is noted which most likely is physiologic. IMPRESSION: No significant abnormality seen in the pelvis. Electronically Signed   By: Lupita Raider, M.D.   On: 02/25/2018 15:50   Ct Abdomen Pelvis W Contrast  Result Date: 02/25/2018 CLINICAL DATA:  Right lower quadrant pain. EXAM: CT ABDOMEN AND PELVIS WITH CONTRAST TECHNIQUE: Multidetector CT imaging of the abdomen and pelvis was performed using the standard protocol following bolus administration of intravenous contrast. CONTRAST:  OMNIPAQUE IOHEXOL 300 MG/ML  SOLN COMPARISON:  None. FINDINGS: Lower chest: No acute abnormality. Hepatobiliary: There is a mass, likely arising from the central left hepatic lobe posteriorly as seen on axial image 9 and coronal image 26 measuring 3.9 x 3.0 x 5.4 cm. There is a small amount of peripheral enhancement suggested. The remainder of the liver is normal. The gallbladder is otherwise normal. Pancreas: Unremarkable. No pancreatic ductal dilatation or surrounding inflammatory changes. Spleen: Normal in size without focal abnormality. Adrenals/Urinary Tract: Adrenal glands are unremarkable. Kidneys are normal, without renal calculi, focal  lesion, or hydronephrosis. Bladder is unremarkable. Stomach/Bowel: The stomach is normal in appearance. The small bowel is normal. The colon is unremarkable. The appendix is markedly abnormal with a proximal appendicolith, dilatation, and adjacent stranding. No definitive adjacent fluid collections or extraluminal air. Vascular/Lymphatic: No significant vascular findings are present. No enlarged abdominal or pelvic lymph nodes. Reproductive: Uterus and bilateral adnexa are unremarkable. Other: Free fluid in the pelvis could be reactive to the appendicitis versus vis the logic and a female patient of this age. There is an umbilical hernia containing a small loop of transverse colon without obstruction. Musculoskeletal: No acute or significant osseous findings. IMPRESSION: 1. Appendicitis. Appendix: Location: Originates from the cecum in the right side of the pelvis and extends just across midline to the left. Diameter: 15 mm Appendicolith: Yes Mucosal hyper-enhancement: No Extraluminal gas: No Periappendiceal collection: No 2. There is a mass in the upper abdomen thought to arise from the left hepatic lobe. 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 Recommend an MRI as an outpatient for further evaluation. 3. Umbilical hernia. Electronically Signed   By: Gerome Sam III M.D   On: 02/25/2018 17:46   US Pelvic Doppler (torsion R/o Or Mass Arterial Flow)  Result Date: 02/25/2018 CLINICAL DATA:  Acute pelvic pain. EXAM: TRANSABDOMINAL AND TRANSVAGINAL ULTRASOUND OF PELVIS DOPPLER ULTRASOUND OF OVARIES TECHNIQUE: Both transabdominal and transvaginal ultrasound  examinations of the pelvis were performed. Transabdominal technique was performed for global imaging of the pelvis including uterus, ovaries, adnexal regions, and pelvic cul-de-sac. It was necessary to proceed with endovaginal exam following the transabdominal exam to visualize the endometrium and ovaries. Color and  duplex Doppler ultrasound was utilized to evaluate blood flow to the ovaries. COMPARISON:  None. FINDINGS: Uterus Measurements: 7.0 x 4.5 x 3.0 cm. No fibroids or other mass visualized. Endometrium Thickness: 5.5 mm which is within normal limits. No focal abnormality visualized. Right ovary Measurements: 3.1 x 1.5 x 1.2 cm. Normal appearance/no adnexal mass. Left ovary Measurements: 3.8 x 2.5 x 1.4 cm. Normal appearance/no adnexal mass. Pulsed Doppler evaluation of both ovaries demonstrates normal low-resistance arterial and venous waveforms. Other findings Small amount of free fluid is noted which most likely is physiologic. IMPRESSION: No significant abnormality seen in the pelvis. Electronically Signed   By: Lupita Raider, M.D.   On: 02/25/2018 15:50    Anti-infectives: Anti-infectives (From admission, onward)   Start     Dose/Rate Route Frequency Ordered Stop   02/26/18 1000  cefTRIAXone (ROCEPHIN) 2 g in sodium chloride 0.9 % 100 mL IVPB     2 g 200 mL/hr over 30 Minutes Intravenous Every 24 hours 02/25/18 2328     02/26/18 0000  metroNIDAZOLE (FLAGYL) IVPB 500 mg     500 mg 100 mL/hr over 60 Minutes Intravenous Every 8 hours 02/25/18 2328     02/25/18 1800  cefTRIAXone (ROCEPHIN) 2 g in sodium chloride 0.9 % 100 mL IVPB     2 g 200 mL/hr over 30 Minutes Intravenous  Once 02/25/18 1751 02/25/18 1845   02/25/18 1800  metroNIDAZOLE (FLAGYL) IVPB 500 mg     500 mg 100 mL/hr over 60 Minutes Intravenous  Once 02/25/18 1751 02/26/18 0040       Assessment/Plan Umbilical hernia Acute appendicitis S/p laparoscopic appendectomy and umbilical hernia repair 5/7 Dr. Corliss Skains - POD 1 - intraoperatively found to have purulent free fluid in the pelvis but no perforation or abscess - no flatus/BM, tolerating diet without n/v  ID - rocephin/flagyl 5/7>> FEN - IVF, regular diet VTE - SCDs, lovenox Foley - none Follow up - [facility]  Plan - Continue IV antibiotics today. Will recheck this  afternoon for possible discharge today vs tomorrow. Encourage ambulation.   LOS: 1 day    Franne Forts , [facility] 02/26/2018, 9:13 AM Pager: [phone] Consults: [phone] Mon-Fri 7:00 am-4:30 pm Sat-Sun 7:00 am-11:30 am

## 2018-02-27 LAB — BASIC METABOLIC PANEL
Anion gap: 6 (ref 5–15)
BUN: 7 mg/dL (ref 6–20)
CHLORIDE: 106 mmol/L (ref 101–111)
CO2: 28 mmol/L (ref 22–32)
Calcium: 7.7 mg/dL — ABNORMAL LOW (ref 8.9–10.3)
Creatinine, Ser: 0.88 mg/dL (ref 0.44–1.00)
Glucose, Bld: 93 mg/dL (ref 65–99)
POTASSIUM: 3.2 mmol/L — AB (ref 3.5–5.1)
SODIUM: 140 mmol/L (ref 135–145)

## 2018-02-27 LAB — CBC
HCT: 29.4 % — ABNORMAL LOW (ref 36.0–46.0)
HEMOGLOBIN: 9.7 g/dL — AB (ref 12.0–15.0)
MCH: 29.5 pg (ref 26.0–34.0)
MCHC: 33 g/dL (ref 30.0–36.0)
MCV: 89.4 fL (ref 78.0–100.0)
PLATELETS: 189 10*3/uL (ref 150–400)
RBC: 3.29 MIL/uL — AB (ref 3.87–5.11)
RDW: 13.2 % (ref 11.5–15.5)
WBC: 9.2 10*3/uL (ref 4.0–10.5)

## 2018-02-27 MED ORDER — POTASSIUM CHLORIDE CRYS ER 20 MEQ PO TBCR
40.0000 meq | EXTENDED_RELEASE_TABLET | Freq: Two times a day (BID) | ORAL | Status: DC
  Administered 2018-02-27: 40 meq via ORAL
  Filled 2018-02-27: qty 2

## 2018-02-27 MED ORDER — MAGNESIUM HYDROXIDE 400 MG/5ML PO SUSP
15.0000 mL | Freq: Once | ORAL | Status: AC
  Administered 2018-02-27: 15 mL via ORAL
  Filled 2018-02-27: qty 30

## 2018-02-27 NOTE — Discharge Summary (Signed)
[facility] Discharge Summary   Patient ID: Charlotte Giles MRN: [id number] DOB/AGE: 07-11-1986 32 y.o.  Admit date: 02/25/2018 Discharge date: 02/27/2018  Admitting Diagnosis: Acute appendicitis  Discharge Diagnosis Patient Active Problem List   Diagnosis Date Noted  . Acute appendicitis 02/25/2018  . Family history of ovarian cancer 01/15/2017    Consultants None  Imaging: US Pelvis Transvanginal Non-ob (tv Only)  Result Date: 02/25/2018 CLINICAL DATA:  Acute pelvic pain. EXAM: TRANSABDOMINAL AND TRANSVAGINAL ULTRASOUND OF PELVIS DOPPLER ULTRASOUND OF OVARIES TECHNIQUE: Both transabdominal and transvaginal ultrasound examinations of the pelvis were performed. Transabdominal technique was performed for global imaging of the pelvis including uterus, ovaries, adnexal regions, and pelvic cul-de-sac. It was necessary to proceed with endovaginal exam following the transabdominal exam to visualize the endometrium and ovaries. Color and duplex Doppler ultrasound was utilized to evaluate blood flow to the ovaries. COMPARISON:  None. FINDINGS: Uterus Measurements: 7.0 x 4.5 x 3.0 cm. No fibroids or other mass visualized. Endometrium Thickness: 5.5 mm which is within normal limits. No focal abnormality visualized. Right ovary Measurements: 3.1 x 1.5 x 1.2 cm. Normal appearance/no adnexal mass. Left ovary Measurements: 3.8 x 2.5 x 1.4 cm. Normal appearance/no adnexal mass. Pulsed Doppler evaluation of both ovaries demonstrates normal low-resistance arterial and venous waveforms. Other findings Small amount of free fluid is noted which most likely is physiologic. IMPRESSION: No significant abnormality seen in the pelvis. Electronically Signed   By: Lupita Raider, M.D.   On: 02/25/2018 15:50   US Pelvis Complete  Result Date: 02/25/2018 CLINICAL DATA:  Acute pelvic pain. EXAM: TRANSABDOMINAL AND TRANSVAGINAL ULTRASOUND OF PELVIS DOPPLER ULTRASOUND OF OVARIES TECHNIQUE: Both  transabdominal and transvaginal ultrasound examinations of the pelvis were performed. Transabdominal technique was performed for global imaging of the pelvis including uterus, ovaries, adnexal regions, and pelvic cul-de-sac. It was necessary to proceed with endovaginal exam following the transabdominal exam to visualize the endometrium and ovaries. Color and duplex Doppler ultrasound was utilized to evaluate blood flow to the ovaries. COMPARISON:  None. FINDINGS: Uterus Measurements: 7.0 x 4.5 x 3.0 cm. No fibroids or other mass visualized. Endometrium Thickness: 5.5 mm which is within normal limits. No focal abnormality visualized. Right ovary Measurements: 3.1 x 1.5 x 1.2 cm. Normal appearance/no adnexal mass. Left ovary Measurements: 3.8 x 2.5 x 1.4 cm. Normal appearance/no adnexal mass. Pulsed Doppler evaluation of both ovaries demonstrates normal low-resistance arterial and venous waveforms. Other findings Small amount of free fluid is noted which most likely is physiologic. IMPRESSION: No significant abnormality seen in the pelvis. Electronically Signed   By: Lupita Raider, M.D.   On: 02/25/2018 15:50   Ct Abdomen Pelvis W Contrast  Result Date: 02/25/2018 CLINICAL DATA:  Right lower quadrant pain. EXAM: CT ABDOMEN AND PELVIS WITH CONTRAST TECHNIQUE: Multidetector CT imaging of the abdomen and pelvis was performed using the standard protocol following bolus administration of intravenous contrast. CONTRAST:  OMNIPAQUE IOHEXOL 300 MG/ML  SOLN COMPARISON:  None. FINDINGS: Lower chest: No acute abnormality. Hepatobiliary: There is a mass, likely arising from the central left hepatic lobe posteriorly as seen on axial image 9 and coronal image 26 measuring 3.9 x 3.0 x 5.4 cm. There is a small amount of peripheral enhancement suggested. The remainder of the liver is normal. The gallbladder is otherwise normal. Pancreas: Unremarkable. No pancreatic ductal dilatation or surrounding inflammatory changes.  Spleen: Normal in size without focal abnormality. Adrenals/Urinary Tract: Adrenal glands are unremarkable. Kidneys are normal, without renal calculi,  focal lesion, or hydronephrosis. Bladder is unremarkable. Stomach/Bowel: The stomach is normal in appearance. The small bowel is normal. The colon is unremarkable. The appendix is markedly abnormal with a proximal appendicolith, dilatation, and adjacent stranding. No definitive adjacent fluid collections or extraluminal air. Vascular/Lymphatic: No significant vascular findings are present. No enlarged abdominal or pelvic lymph nodes. Reproductive: Uterus and bilateral adnexa are unremarkable. Other: Free fluid in the pelvis could be reactive to the appendicitis versus vis the logic and a female patient of this age. There is an umbilical hernia containing a small loop of transverse colon without obstruction. Musculoskeletal: No acute or significant osseous findings. IMPRESSION: 1. Appendicitis. Appendix: Location: Originates from the cecum in the right side of the pelvis and extends just across midline to the left. Diameter: 15 mm Appendicolith: Yes Mucosal hyper-enhancement: No Extraluminal gas: No Periappendiceal collection: No 2. There is a mass in the upper abdomen thought to arise from the left hepatic lobe. 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 Recommend an MRI as an outpatient for further evaluation. 3. Umbilical hernia. Electronically Signed   By: Gerome Sam III M.D   On: 02/25/2018 17:46   US Pelvic Doppler (torsion R/o Or Mass Arterial Flow)  Result Date: 02/25/2018 CLINICAL DATA:  Acute pelvic pain. EXAM: TRANSABDOMINAL AND TRANSVAGINAL ULTRASOUND OF PELVIS DOPPLER ULTRASOUND OF OVARIES TECHNIQUE: Both transabdominal and transvaginal ultrasound examinations of the pelvis were performed. Transabdominal technique was performed for global imaging of the pelvis including uterus, ovaries, adnexal regions, and  pelvic cul-de-sac. It was necessary to proceed with endovaginal exam following the transabdominal exam to visualize the endometrium and ovaries. Color and duplex Doppler ultrasound was utilized to evaluate blood flow to the ovaries. COMPARISON:  None. FINDINGS: Uterus Measurements: 7.0 x 4.5 x 3.0 cm. No fibroids or other mass visualized. Endometrium Thickness: 5.5 mm which is within normal limits. No focal abnormality visualized. Right ovary Measurements: 3.1 x 1.5 x 1.2 cm. Normal appearance/no adnexal mass. Left ovary Measurements: 3.8 x 2.5 x 1.4 cm. Normal appearance/no adnexal mass. Pulsed Doppler evaluation of both ovaries demonstrates normal low-resistance arterial and venous waveforms. Other findings Small amount of free fluid is noted which most likely is physiologic. IMPRESSION: No significant abnormality seen in the pelvis. Electronically Signed   By: Lupita Raider, M.D.   On: 02/25/2018 15:50    Procedures Dr. Corliss Skains (02/25/18) - Laparoscopic Appendectomy and repair umbilical hernia  Hospital Course:  Charlotte Giles is a 32yo female who [facility] with 1 days of abdominal pain, nausea, and vomiting.  Workup showed appendicitis.  Patient was admitted and underwent procedure listed above.  Intraoperatively found to have purulent free fluid in the pelvis but no perforation or abscess. Tolerated procedure well and was transferred to the floor.  Patient did have a mild ileus postoperatively, but this improved with time. Diet was advanced as tolerated.  On POD2 the patient was voiding well, tolerating diet, ambulating well, pain well controlled, vital signs stable, incisions c/d/i and felt stable for discharge home.  Patient will follow up as below and knows to call with questions or concerns.    I have personally reviewed the patients medication history on the [place] controlled substance database.    Allergies as of 02/27/2018   No Known Allergies     Medication List    TAKE these  medications   acetaminophen 500 MG tablet Commonly known as:  TYLENOL Take 500-1,000 mg by  mouth every 8 (eight) hours as needed (for headaches).   ibuprofen 200 MG tablet Commonly known as:  ADVIL,MOTRIN Take 600 mg by mouth every 6 (six) hours as needed (for headaches). What changed:  Another medication with the same name was removed. Continue taking this medication, and follow the directions you see here.   oxyCODONE 5 MG immediate release tablet Commonly known as:  Oxy IR/ROXICODONE Take 1 tablet (5 mg total) by mouth every 6 (six) hours as needed for severe pain.   prenatal multivitamin Tabs tablet Take 1 tablet by mouth daily at 12 noon.        Follow-up Information    [facility], [place]. Go on 03/11/2018.   Specialty:  General Surgery Why:  Your appointment is 05/21 at 11:30 am Please arrive 30 minutes prior to your appointment to check in and fill out paperwork. Bring photo ID and insurance information. Contact information: [address] [place] [address] [phone]          Signed: Franne Forts, [facility] 02/27/2018, 2:53 PM Pager: [phone] Consults: [phone] Mon-Fri 7:00 am-4:30 pm Sat-Sun 7:00 am-11:30 am

## 2018-02-27 NOTE — Progress Notes (Signed)
[facility] Progress Note  2 Days Post-Op  Subjective: CC- appy Patient states that she is feeling better than yesterday. Abdominal soreness improving. Denies n/v. Still does not have much of an appetite, but she is tolerating what she is eating/drinking. No flatus or BM, but states that she feels more rumbling in her abdomen today and feels like she is going to have a BM. She also reports a little persistent burping.  Objective: Vital signs in last 24 hours: Temp:  [98.3 F (36.8 C)-98.8 F (37.1 C)] 98.7 F (37.1 C) (05/09 0504) Pulse Rate:  [68-88] 88 (05/09 0504) Resp:  [16] 16 (05/09 0504) BP: (90-98)/(54-64) 90/64 (05/09 0504) SpO2:  [98 %-100 %] 100 % (05/09 0504) Last BM Date: 02/25/18  Intake/Output from previous day: 05/08 0701 - 05/09 0700 In: 1980 [P.O.:200; I.V.:1680; IV Piggyback:100] Out: 1500 [Urine:1500] Intake/Output this shift: No intake/output data recorded.  PE: Gen:  Alert, NAD, pleasant HEENT: EOM's intact, pupils equal and round Card:  RRR, no M/G/R heard Pulm:  CTAB, no W/R/R, effort normal Abd: Soft, mild distension, +BS, lap incisions with C/D/I dressings in place  Lab Results:  Recent Labs    02/26/18 0417 02/27/18 0600  WBC 9.3 9.2  HGB 11.1* 9.7*  HCT 33.1* 29.4*  PLT 231 189   BMET Recent Labs    02/26/18 0417 02/27/18 0600  NA 139 140  K 3.6 3.2*  CL 105 106  CO2 26 28  GLUCOSE 162* 93  BUN 7 7  CREATININE 0.98 0.88  CALCIUM 8.2* 7.7*   PT/INR No results for input(s): LABPROT, INR in the last 72 hours. CMP     Component Value Date/Time   NA 140 02/27/2018 0600   K 3.2 (L) 02/27/2018 0600   CL 106 02/27/2018 0600   CO2 28 02/27/2018 0600   GLUCOSE 93 02/27/2018 0600   BUN 7 02/27/2018 0600   CREATININE 0.88 02/27/2018 0600   CREATININE 0.81 01/15/2017 1431   CALCIUM 7.7 (L) 02/27/2018 0600   PROT 7.4 02/25/2018 1345   ALBUMIN 4.3 02/25/2018 1345   AST 22 02/25/2018 1345   ALT 20 02/25/2018 1345   ALKPHOS 43 02/25/2018 1345   BILITOT 1.5 (H) 02/25/2018 1345   GFRNONAA >60 02/27/2018 0600   GFRAA >60 02/27/2018 0600   Lipase  No results found for: LIPASE     Studies/Results: US Pelvis Transvanginal Non-ob (tv Only)  Result Date: 02/25/2018 CLINICAL DATA:  Acute pelvic pain. EXAM: TRANSABDOMINAL AND TRANSVAGINAL ULTRASOUND OF PELVIS DOPPLER ULTRASOUND OF OVARIES TECHNIQUE: Both transabdominal and transvaginal ultrasound examinations of the pelvis were performed. Transabdominal technique was performed for global imaging of the pelvis including uterus, ovaries, adnexal regions, and pelvic cul-de-sac. It was necessary to proceed with endovaginal exam following the transabdominal exam to visualize the endometrium and ovaries. Color and duplex Doppler ultrasound was utilized to evaluate blood flow to the ovaries. COMPARISON:  None. FINDINGS: Uterus Measurements: 7.0 x 4.5 x 3.0 cm. No fibroids or other mass visualized. Endometrium Thickness: 5.5 mm which is within normal limits. No focal abnormality visualized. Right ovary Measurements: 3.1 x 1.5 x 1.2 cm. Normal appearance/no adnexal mass. Left ovary Measurements: 3.8 x 2.5 x 1.4 cm. Normal appearance/no adnexal mass. Pulsed Doppler evaluation of both ovaries demonstrates normal low-resistance arterial and venous waveforms. Other findings Small amount of free fluid is noted which most likely is physiologic. IMPRESSION: No significant abnormality seen in the pelvis. Electronically Signed   By: Lupita Raider, M.D.  On: 02/25/2018 15:50   US Pelvis Complete  Result Date: 02/25/2018 CLINICAL DATA:  Acute pelvic pain. EXAM: TRANSABDOMINAL AND TRANSVAGINAL ULTRASOUND OF PELVIS DOPPLER ULTRASOUND OF OVARIES TECHNIQUE: Both transabdominal and transvaginal ultrasound examinations of the pelvis were performed. Transabdominal technique was performed for global imaging of the pelvis including uterus, ovaries, adnexal regions, and pelvic cul-de-sac. It was  necessary to proceed with endovaginal exam following the transabdominal exam to visualize the endometrium and ovaries. Color and duplex Doppler ultrasound was utilized to evaluate blood flow to the ovaries. COMPARISON:  None. FINDINGS: Uterus Measurements: 7.0 x 4.5 x 3.0 cm. No fibroids or other mass visualized. Endometrium Thickness: 5.5 mm which is within normal limits. No focal abnormality visualized. Right ovary Measurements: 3.1 x 1.5 x 1.2 cm. Normal appearance/no adnexal mass. Left ovary Measurements: 3.8 x 2.5 x 1.4 cm. Normal appearance/no adnexal mass. Pulsed Doppler evaluation of both ovaries demonstrates normal low-resistance arterial and venous waveforms. Other findings Small amount of free fluid is noted which most likely is physiologic. IMPRESSION: No significant abnormality seen in the pelvis. Electronically Signed   By: Lupita Raider, M.D.   On: 02/25/2018 15:50   Ct Abdomen Pelvis W Contrast  Result Date: 02/25/2018 CLINICAL DATA:  Right lower quadrant pain. EXAM: CT ABDOMEN AND PELVIS WITH CONTRAST TECHNIQUE: Multidetector CT imaging of the abdomen and pelvis was performed using the standard protocol following bolus administration of intravenous contrast. CONTRAST:  OMNIPAQUE IOHEXOL 300 MG/ML  SOLN COMPARISON:  None. FINDINGS: Lower chest: No acute abnormality. Hepatobiliary: There is a mass, likely arising from the central left hepatic lobe posteriorly as seen on axial image 9 and coronal image 26 measuring 3.9 x 3.0 x 5.4 cm. There is a small amount of peripheral enhancement suggested. The remainder of the liver is normal. The gallbladder is otherwise normal. Pancreas: Unremarkable. No pancreatic ductal dilatation or surrounding inflammatory changes. Spleen: Normal in size without focal abnormality. Adrenals/Urinary Tract: Adrenal glands are unremarkable. Kidneys are normal, without renal calculi, focal lesion, or hydronephrosis. Bladder is unremarkable. Stomach/Bowel: The stomach  is normal in appearance. The small bowel is normal. The colon is unremarkable. The appendix is markedly abnormal with a proximal appendicolith, dilatation, and adjacent stranding. No definitive adjacent fluid collections or extraluminal air. Vascular/Lymphatic: No significant vascular findings are present. No enlarged abdominal or pelvic lymph nodes. Reproductive: Uterus and bilateral adnexa are unremarkable. Other: Free fluid in the pelvis could be reactive to the appendicitis versus vis the logic and a female patient of this age. There is an umbilical hernia containing a small loop of transverse colon without obstruction. Musculoskeletal: No acute or significant osseous findings. IMPRESSION: 1. Appendicitis. Appendix: Location: Originates from the cecum in the right side of the pelvis and extends just across midline to the left. Diameter: 15 mm Appendicolith: Yes Mucosal hyper-enhancement: No Extraluminal gas: No Periappendiceal collection: No 2. There is a mass in the upper abdomen thought to arise from the left hepatic lobe. 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 Recommend an MRI as an outpatient for further evaluation. 3. Umbilical hernia. Electronically Signed   By: Gerome Sam III M.D   On: 02/25/2018 17:46   US Pelvic Doppler (torsion R/o Or Mass Arterial Flow)  Result Date: 02/25/2018 CLINICAL DATA:  Acute pelvic pain. EXAM: TRANSABDOMINAL AND TRANSVAGINAL ULTRASOUND OF PELVIS DOPPLER ULTRASOUND OF OVARIES TECHNIQUE: Both transabdominal and transvaginal ultrasound examinations of the pelvis were performed. Transabdominal technique was performed for global  imaging of the pelvis including uterus, ovaries, adnexal regions, and pelvic cul-de-sac. It was necessary to proceed with endovaginal exam following the transabdominal exam to visualize the endometrium and ovaries. Color and duplex Doppler ultrasound was utilized to evaluate blood flow to the ovaries.  COMPARISON:  None. FINDINGS: Uterus Measurements: 7.0 x 4.5 x 3.0 cm. No fibroids or other mass visualized. Endometrium Thickness: 5.5 mm which is within normal limits. No focal abnormality visualized. Right ovary Measurements: 3.1 x 1.5 x 1.2 cm. Normal appearance/no adnexal mass. Left ovary Measurements: 3.8 x 2.5 x 1.4 cm. Normal appearance/no adnexal mass. Pulsed Doppler evaluation of both ovaries demonstrates normal low-resistance arterial and venous waveforms. Other findings Small amount of free fluid is noted which most likely is physiologic. IMPRESSION: No significant abnormality seen in the pelvis. Electronically Signed   By: Lupita Raider, M.D.   On: 02/25/2018 15:50    Anti-infectives: Anti-infectives (From admission, onward)   Start     Dose/Rate Route Frequency Ordered Stop   02/26/18 1000  cefTRIAXone (ROCEPHIN) 2 g in sodium chloride 0.9 % 100 mL IVPB     2 g 200 mL/hr over 30 Minutes Intravenous Every 24 hours 02/25/18 2328     02/26/18 0000  metroNIDAZOLE (FLAGYL) IVPB 500 mg     500 mg 100 mL/hr over 60 Minutes Intravenous Every 8 hours 02/25/18 2328     02/25/18 1800  cefTRIAXone (ROCEPHIN) 2 g in sodium chloride 0.9 % 100 mL IVPB     2 g 200 mL/hr over 30 Minutes Intravenous  Once 02/25/18 1751 02/25/18 1845   02/25/18 1800  metroNIDAZOLE (FLAGYL) IVPB 500 mg     500 mg 100 mL/hr over 60 Minutes Intravenous  Once 02/25/18 1751 02/26/18 0040       Assessment/Plan Umbilical hernia Acute appendicitis S/p laparoscopic appendectomy and umbilical hernia repair 5/7 Dr. Corliss Skains - POD 2 - intraoperatively found to have purulent free fluid in the pelvis but no perforation or abscess - no flatus/BM, tolerating diet without n/v  ID - rocephin/flagyl 5/7>>5/9 FEN - IVF, regular diet VTE - SCDs, lovenox Foley - none Follow up - [facility]  Plan - WBC WNL and patient afebrile. D/c antibiotics. Continue ambulating. Await bowel function; will recheck this afternoon for  possible discharge today vs tomorrow.    LOS: 1 day    Franne Forts , [facility] 02/27/2018, 8:48 AM Pager: [phone] Consults: [phone] Mon-Fri 7:00 am-4:30 pm Sat-Sun 7:00 am-11:30 am

## 2018-02-27 NOTE — Progress Notes (Signed)
Patient discharged to home. Verbalizes understanding of all discharge instructions including incision care, discharge medications, and follow up MD visits. Patient accompanied by spouse.  

## 2018-02-28 NOTE — Anesthesia Postprocedure Evaluation (Signed)
Anesthesia Post Note  Patient: Charlotte Giles  Procedure(s) Performed: APPENDECTOMY LAPAROSCOPIC (N/A Abdomen)     Patient location during evaluation: PACU Anesthesia Type: General Level of consciousness: awake and alert Pain management: pain level controlled Vital Signs Assessment: post-procedure vital signs reviewed and stable Respiratory status: spontaneous breathing, nonlabored ventilation, respiratory function stable and patient connected to nasal cannula oxygen Cardiovascular status: blood pressure returned to baseline and stable Postop Assessment: no apparent nausea or vomiting Anesthetic complications: no    Last Vitals:  Vitals:   02/27/18 0504 02/27/18 1337  BP: 90/64 92/64  Pulse: 88 80  Resp: 16 16  Temp: 37.1 C 36.8 C  SpO2: 100% 100%    Last Pain:  Vitals:   02/27/18 1337  TempSrc: Oral  PainSc:                  Brad Lieurance

## 2018-03-18 ENCOUNTER — Encounter: Payer: Self-pay | Admitting: Family Medicine

## 2018-03-25 ENCOUNTER — Ambulatory Visit: Payer: BLUE CROSS/BLUE SHIELD | Admitting: Adult Health

## 2018-03-25 ENCOUNTER — Encounter: Payer: Self-pay | Admitting: Adult Health

## 2018-03-25 VITALS — BP 110/78 | Temp 98.2°F | Wt 115.0 lb

## 2018-03-25 DIAGNOSIS — Z0000 Encounter for general adult medical examination without abnormal findings: Secondary | ICD-10-CM | POA: Diagnosis not present

## 2018-03-25 DIAGNOSIS — Z0001 Encounter for general adult medical examination with abnormal findings: Secondary | ICD-10-CM

## 2018-03-25 DIAGNOSIS — R160 Hepatomegaly, not elsewhere classified: Secondary | ICD-10-CM | POA: Diagnosis not present

## 2018-03-25 LAB — CBC WITH DIFFERENTIAL/PLATELET
BASOS ABS: 0 10*3/uL (ref 0.0–0.1)
Basophils Relative: 0.2 % (ref 0.0–3.0)
EOS PCT: 1.1 % (ref 0.0–5.0)
Eosinophils Absolute: 0.1 10*3/uL (ref 0.0–0.7)
HCT: 35.2 % — ABNORMAL LOW (ref 36.0–46.0)
HEMOGLOBIN: 11.7 g/dL — AB (ref 12.0–15.0)
LYMPHS ABS: 3.2 10*3/uL (ref 0.7–4.0)
LYMPHS PCT: 36.7 % (ref 12.0–46.0)
MCHC: 33.3 g/dL (ref 30.0–36.0)
MCV: 88.7 fl (ref 78.0–100.0)
MONOS PCT: 7.7 % (ref 3.0–12.0)
Monocytes Absolute: 0.7 10*3/uL (ref 0.1–1.0)
Neutro Abs: 4.7 10*3/uL (ref 1.4–7.7)
Neutrophils Relative %: 54.3 % (ref 43.0–77.0)
Platelets: 255 10*3/uL (ref 150.0–400.0)
RBC: 3.96 Mil/uL (ref 3.87–5.11)
RDW: 13.5 % (ref 11.5–15.5)
WBC: 8.6 10*3/uL (ref 4.0–10.5)

## 2018-03-25 LAB — HEPATIC FUNCTION PANEL
ALBUMIN: 4.4 g/dL (ref 3.5–5.2)
ALK PHOS: 47 U/L (ref 39–117)
ALT: 9 U/L (ref 0–35)
AST: 13 U/L (ref 0–37)
Bilirubin, Direct: 0.2 mg/dL (ref 0.0–0.3)
Total Bilirubin: 0.9 mg/dL (ref 0.2–1.2)
Total Protein: 7.3 g/dL (ref 6.0–8.3)

## 2018-03-25 LAB — BASIC METABOLIC PANEL
BUN: 10 mg/dL (ref 6–23)
CALCIUM: 9.9 mg/dL (ref 8.4–10.5)
CO2: 28 mEq/L (ref 19–32)
Chloride: 103 mEq/L (ref 96–112)
Creatinine, Ser: 0.7 mg/dL (ref 0.40–1.20)
GFR: 125.31 mL/min (ref >60.00)
GLUCOSE: 87 mg/dL (ref 70–99)
POTASSIUM: 4 meq/L (ref 3.5–5.1)
Sodium: 139 mEq/L (ref 135–145)

## 2018-03-25 LAB — LIPID PANEL
Cholesterol: 135 mg/dL (ref 0–200)
HDL: 43.7 mg/dL (ref >39.00)
LDL CALC: 83 mg/dL (ref 0–99)
NONHDL: 91.73
Total CHOL/HDL Ratio: 3
Triglycerides: 45 mg/dL (ref 0.0–149.0)
VLDL: 9 mg/dL (ref 0.0–40.0)

## 2018-03-25 LAB — TSH: TSH: 2.08 u[IU]/mL (ref 0.35–4.50)

## 2018-03-25 NOTE — Progress Notes (Signed)
Patient presents to clinic today to establish care. 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   She is a former patient of Delbert Harness, NP   Acute Concerns: Establish Care/CPE   S/p Acute appendicitis -was hospitalized on 02/25/2018 discharged on 02/27/2018 for acute appendicitis.  He was admitted and had a laparoscopic appendectomy performed.  Intraoperatively she was found to have purulent free fluid in the pelvis but no perforation or abscess.  While she is feeling improved but continues to have slight discomfort in her lower abdomen.  Incidental finding on CT of the abdomen during this time showed a mass in the upper abdomen that was thought to arise from the left hepatic lobe.  It was thought this was probably a cavernous hemangioma, it was not definitive on the study.  It was recommended an MRI as an outpatient for further evaluation.  Chronic Issues: None  Health Maintenance: Dental -- Routine Care Vision -- Routine Care  Immunizations -- UTD  Colonoscopy -- None  PAP -- Had abnormal PAP in 2012 - normal since then. Is seen by GYN      Past Medical History:  Diagnosis Date  . Acute blood loss anemia 12/12/2013  . Anemia   . 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 HSV infection     Past Surgical History:  Procedure Laterality Date  . LAPAROSCOPIC APPENDECTOMY N/A 02/25/2018   Procedure: APPENDECTOMY LAPAROSCOPIC;  Surgeon: Manus Rudd, MD;  Location: [facility] OR;  Service: General;  Laterality: N/A;    Current Outpatient Medications on File Prior to Visit  Medication Sig Dispense Refill  . acetaminophen (TYLENOL) 500 MG tablet Take 500-1,000 mg by mouth every 8 (eight) hours as needed (for headaches).     Marland Kitchen ibuprofen (ADVIL,MOTRIN) 200 MG tablet Take 600 mg by mouth every  6 (six) hours as needed (for headaches).     . Prenatal Vit-Fe Fumarate-FA (PRENATAL MULTIVITAMIN) TABS tablet Take 1 tablet by mouth daily at 12 noon.     No current facility-administered medications on file prior to visit.     No Known Allergies  Family History  Problem Relation Age of Onset  . Hypertension Mother   . Ovarian cancer Mother   . Cancer Mother   . Cancer Father 33       PROSTATE  . Diabetes Maternal Grandmother   . Hypertension Maternal Grandmother     Social History   Socioeconomic History  . Marital status: Married    Spouse name: Not on file  . Number of children: Not on file  . Years of education: Not on file  . Highest education level: Not on file  Occupational History  . Occupation: nurse  Social Needs  . Financial resource strain: Not on file  . Food insecurity:    Worry: Not on file    Inability: Not on file  . Transportation needs:    Medical: Not on file    Non-medical: Not on file  Tobacco Use  . Smoking status: Never Smoker  . Smokeless tobacco: Never Used  Substance and Sexual Activity  . Alcohol use: Yes    Alcohol/week: 0.0 oz    Comment: social   . Drug use: No  . Sexual activity: Yes    Partners: Male    Birth control/protection: Condom  Comment: 1st intercourse- 18, partners- 2, married- 6 yrs   Lifestyle  . Physical activity:    Days per week: Not on file    Minutes per session: Not on file  . Stress: Not on file  Relationships  . Social connections:    Talks on phone: Not on file    Gets together: Not on file    Attends religious service: Not on file    Active member of club or organization: Not on file    Attends meetings of clubs or organizations: Not on file    Relationship status: Not on file  . Intimate partner violence:    Fear of current or ex partner: Not on file    Emotionally abused: Not on file    Physically abused: Not on file    Forced sexual activity: Not on file  Other Topics Concern  . Not on  file  Social History Narrative  . Not on file    Review of Systems  Constitutional: Negative.   HENT: Negative.   Eyes: Negative.   Respiratory: Negative.   Cardiovascular: Negative.   Gastrointestinal: Positive for abdominal pain.  Genitourinary: Negative.   Musculoskeletal: Negative.   Skin: Negative.   Neurological: Negative.   Endo/Heme/Allergies: Negative.   Psychiatric/Behavioral: Negative.   All other systems reviewed and are negative.     BP 110/78   Temp 98.2 F (36.8 C) (Oral)   Wt 115 lb (52.2 kg)   BMI 21.73 kg/m   Physical Exam  Constitutional: She is oriented to person, place, and time. She appears well-developed and well-nourished. No distress.  HENT:  Head: Normocephalic and atraumatic.  Right Ear: External ear normal.  Left Ear: External ear normal.  Nose: Nose normal.  Mouth/Throat: Oropharynx is clear and moist. No oropharyngeal exudate.  Eyes: Pupils are equal, round, and reactive to light. Conjunctivae and EOM are normal. Right eye exhibits no discharge. Left eye exhibits no discharge. No scleral icterus.  Neck: Normal range of motion. Neck supple. No JVD present. No tracheal deviation present. No thyromegaly present.  Cardiovascular: Normal rate, regular rhythm, normal heart sounds and intact distal pulses. Exam reveals no gallop and no friction rub.  No murmur heard. Pulmonary/Chest: Effort normal and breath sounds normal. No stridor. No respiratory distress. She has no wheezes. She has no rales. She exhibits no tenderness.  Abdominal: Soft. Bowel sounds are normal. She exhibits no distension and no mass. There is no tenderness. There is no rebound and no guarding. No hernia.  Genitourinary:  Genitourinary Comments: Done by GYN   Musculoskeletal: Normal range of motion. She exhibits no edema, tenderness or deformity.  Lymphadenopathy:    She has no cervical adenopathy.  Neurological: She is alert and oriented to person, place, and time. She  displays normal reflexes. No cranial nerve deficit or sensory deficit. She exhibits normal muscle tone. Coordination normal.  Skin: Skin is warm and dry. Capillary refill takes less than 2 seconds. No rash noted. She is not diaphoretic. No erythema. No pallor.  Psychiatric: She has a normal mood and affect. Her behavior is normal. Judgment and thought content normal.  Nursing note and vitals reviewed.  Assessment/Plan: 1. Routine general medical examination at a health care facility - Benign exam. Healthy 32 year old female  - Follow up in one year or sooner if needed - Basic metabolic panel - CBC with Differential/Platelet - Hepatic function panel - Lipid panel - TSH  2. Liver mass, left lobe  - MR  Abdomen W Wo Contrast; Future   Shirline Frees, NP

## 2018-04-02 ENCOUNTER — Ambulatory Visit
Admission: RE | Admit: 2018-04-02 | Discharge: 2018-04-02 | Disposition: A | Discharge status: Home / Self Care | Payer: BLUE CROSS/BLUE SHIELD | Source: Ambulatory Visit | Attending: Adult Health | Admitting: Adult Health

## 2018-04-02 ENCOUNTER — Other Ambulatory Visit: Payer: Self-pay | Admitting: Adult Health

## 2018-04-02 DIAGNOSIS — K7689 Other specified diseases of liver: Secondary | ICD-10-CM | POA: Diagnosis not present

## 2018-04-02 DIAGNOSIS — R160 Hepatomegaly, not elsewhere classified: Secondary | ICD-10-CM

## 2018-08-19 ENCOUNTER — Other Ambulatory Visit: Payer: Self-pay | Admitting: Family Medicine

## 2018-08-19 DIAGNOSIS — R160 Hepatomegaly, not elsewhere classified: Secondary | ICD-10-CM

## 2018-08-26 ENCOUNTER — Ambulatory Visit
Admission: RE | Admit: 2018-08-26 | Discharge: 2018-08-26 | Disposition: A | Discharge status: Home / Self Care | Payer: BLUE CROSS/BLUE SHIELD | Source: Ambulatory Visit | Attending: Adult Health | Admitting: Adult Health

## 2018-08-26 ENCOUNTER — Other Ambulatory Visit: Payer: Self-pay | Admitting: Adult Health

## 2018-08-26 DIAGNOSIS — K769 Liver disease, unspecified: Secondary | ICD-10-CM | POA: Diagnosis not present

## 2018-08-26 DIAGNOSIS — R160 Hepatomegaly, not elsewhere classified: Secondary | ICD-10-CM

## 2018-09-08 ENCOUNTER — Ambulatory Visit: Payer: BLUE CROSS/BLUE SHIELD | Admitting: Adult Health

## 2018-12-04 ENCOUNTER — Encounter: Payer: Self-pay | Admitting: Adult Health

## 2018-12-04 ENCOUNTER — Ambulatory Visit: Payer: BLUE CROSS/BLUE SHIELD | Admitting: Adult Health

## 2018-12-04 VITALS — BP 100/60 | Temp 97.9°F | Ht 62.0 in | Wt 121.0 lb

## 2018-12-04 DIAGNOSIS — M7989 Other specified soft tissue disorders: Secondary | ICD-10-CM

## 2018-12-04 NOTE — Progress Notes (Signed)
Subjective:    Patient ID: Charlotte Giles, female    DOB: 04/04/1986, 33 y.o.   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  She presents to the office today for an acute issue of lower extremity edema. She has a new job working for [employer] and sits most of the day. She has noticed that bilateral ankles are swelling at the end of the day and then resolve after elevation. She denies chest pain/shortness of breath, or calf pain. She tries to eat a low sodium diet and stays hydrated throughout the day   Review of Systems  See HPI   Past Medical History:  Diagnosis Date  . Acute blood loss anemia 12/12/2013  . Anemia   . 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 HSV infection     Social History   Socioeconomic History  . Marital status: Married    Spouse name: Not on file  . Number of children: Not on file  . Years of education: Not on file  . Highest education level: Not on file  Occupational History  . Occupation: nurse  Social Needs  . Financial resource strain: Not on file  . Food insecurity:    Worry: Not on file    Inability: Not on file  . Transportation needs:    Medical: Not on file    Non-medical: Not on file  Tobacco Use  . Smoking status: Never Smoker  . Smokeless tobacco: Never Used  Substance and Sexual Activity  . Alcohol use: Yes    Alcohol/week: 0.0 standard drinks    Comment: social   . Drug use: No  . Sexual activity: Yes    Partners: Male    Birth control/protection: Condom    Comment: 1st intercourse- 18, partners- 2, married- 6 yrs   Lifestyle  . Physical activity:    Days per week: Not on file    Minutes per session: Not on file  . Stress: Not on file  Relationships  . Social connections:    Talks on phone: Not on file    Gets together:  Not on file    Attends religious service: Not on file    Active member of club or organization: Not on file    Attends meetings of clubs or organizations: Not on file    Relationship status: Not on file  . Intimate partner violence:    Fear of current or ex partner: Not on file    Emotionally abused: Not on file    Physically abused: Not on file    Forced sexual activity: Not on file  Other Topics Concern  . Not on file  Social History Narrative   Nurse in Medical ICU - RN    Two children - 4 and 2    Married - 6 years        Past Surgical History:  Procedure Laterality Date  . LAPAROSCOPIC APPENDECTOMY N/A 02/25/2018   Procedure: APPENDECTOMY LAPAROSCOPIC;  Surgeon: Manus Ruddsuei, Matthew, MD;  Location: [facility] OR;  Service: General;  Laterality: N/A;    Family History  Problem Relation Age of Onset  . Hypertension Mother   . Ovarian cancer Mother   . Cancer Father [phone]       PROSTATE  . Diabetes Maternal Grandmother   . Hypertension  Maternal Grandmother   . Sudden Cardiac Death Maternal Uncle 02-18-36       MI     No Known Allergies  Current Outpatient Medications on File Prior to Visit  Medication Sig Dispense Refill  . acetaminophen (TYLENOL) 500 MG tablet Take 500-1,000 mg by mouth every 8 (eight) hours as needed (for headaches).     Marland Kitchen ibuprofen (ADVIL,MOTRIN) 200 MG tablet Take 600 mg by mouth every 6 (six) hours as needed (for headaches).     . Prenatal Vit-Fe Fumarate-FA (PRENATAL MULTIVITAMIN) TABS tablet Take 1 tablet by mouth daily at 12 noon.     No current facility-administered medications on file prior to visit.     BP 100/60   Temp 97.9 F (36.6 C)   Ht 5\' 2"  (1.575 m)   Wt 121 lb (54.9 kg)   BMI 22.13 kg/m       Objective:   Physical Exam Vitals signs and nursing note reviewed.  Constitutional:      Appearance: Normal appearance.  Cardiovascular:     Rate and Rhythm: Normal rate and regular rhythm.     Pulses: Normal pulses.     Heart sounds: Normal heart  sounds.  Pulmonary:     Effort: Pulmonary effort is normal.     Breath sounds: Normal breath sounds.  Musculoskeletal:        General: No swelling or tenderness.     Right lower leg: No edema.     Left lower leg: No edema.  Neurological:     Mental Status: She is alert.       Assessment & Plan:  1. Swelling of lower extremity - No concern for DVT  - Healthy young female  - Likely from sitting all day  - Advised compression socks  - Paperwork filled out for standing desk   Shirline Frees, NP

## 2019-02-16 ENCOUNTER — Other Ambulatory Visit: Payer: Self-pay

## 2019-02-19 ENCOUNTER — Other Ambulatory Visit: Payer: Self-pay

## 2019-02-19 ENCOUNTER — Ambulatory Visit (INDEPENDENT_AMBULATORY_CARE_PROVIDER_SITE_OTHER): Payer: BLUE CROSS/BLUE SHIELD | Admitting: Obstetrics & Gynecology

## 2019-02-19 ENCOUNTER — Encounter: Payer: Self-pay | Admitting: Obstetrics & Gynecology

## 2019-02-19 VITALS — BP 118/76 | Ht 61.5 in | Wt 123.0 lb

## 2019-02-19 DIAGNOSIS — Z01419 Encounter for gynecological examination (general) (routine) without abnormal findings: Secondary | ICD-10-CM

## 2019-02-19 DIAGNOSIS — Z789 Other specified health status: Secondary | ICD-10-CM

## 2019-02-19 DIAGNOSIS — N72 Inflammatory disease of cervix uteri: Secondary | ICD-10-CM | POA: Diagnosis not present

## 2019-02-19 DIAGNOSIS — Z8041 Family history of malignant neoplasm of ovary: Secondary | ICD-10-CM

## 2019-02-19 DIAGNOSIS — B977 Papillomavirus as the cause of diseases classified elsewhere: Secondary | ICD-10-CM

## 2019-02-19 NOTE — Addendum Note (Signed)
Addended by: Berna Spare A on: 02/19/2019 08:57 AM   Modules accepted: Orders

## 2019-02-19 NOTE — Progress Notes (Signed)
Rashan Patient April 18, 1986 [id number]   History:    33 y.o. G2P2L2 Married.  Works at [employer] [facility] [employer].  RP:  Established patient presenting for annual gyn exam   HPI: Menses regular normal every month.  No pelvic pain.  No pain with IC.  Urine/BMs wnl.  Had surgery for ruptured Appendix/umbilical hernia repair last year.  Breasts normal.  BMI 22.86.  Physically active.  Mother with advanced Ovarian Cancer at 52, BrCa1-2 Negative.  Past medical history,surgical history, family history and social history were all reviewed and documented in the EPIC chart.  Gynecologic History Patient's last menstrual period was 02/07/2019. Contraception: condoms Last Pap: 12/2017. Results were: Negative/HPV HR negative.  H/O HPV HR positive. Last mammogram: Never Bone Density: Never Colonoscopy: Never  Obstetric History OB History  Gravida Para Term Preterm AB Living  2 2 2     2   SAB TAB Ectopic Multiple Live Births        0 2    # Outcome Date GA Lbr Len/2nd Weight Sex Delivery Anes PTL Lv  2 Term 01/14/16 75w6d02:01 / 00:13 7 lb 7.1 oz (3.375 kg) M Vag-Spont EPI  LIV  1 Term 12/11/13 455w4d8:44 / 01:09 7 lb 8.6 oz (3.419 kg) M Vag-Spont EPI, Local  LIV     ROS: A ROS was performed and pertinent positives and negatives are included in the history.  GENERAL: No fevers or chills. HEENT: No change in vision, no earache, sore throat or sinus congestion. NECK: No pain or stiffness. CARDIOVASCULAR: No chest pain or pressure. No palpitations. PULMONARY: No shortness of breath, cough or wheeze. GASTROINTESTINAL: No abdominal pain, nausea, vomiting or diarrhea, melena or bright red blood per rectum. GENITOURINARY: No urinary frequency, urgency, hesitancy or dysuria. MUSCULOSKELETAL: No joint or muscle pain, no back pain, no recent trauma. DERMATOLOGIC: No rash, no itching, no lesions. ENDOCRINE: No polyuria, polydipsia, no heat or cold intolerance. No recent change in weight. HEMATOLOGICAL: No anemia  or easy bruising or bleeding. NEUROLOGIC: No headache, seizures, numbness, tingling or weakness. PSYCHIATRIC: No depression, no loss of interest in normal activity or change in sleep pattern.     Exam:   BP 118/76   Ht 5' 1.5" (1.562 m)   Wt 123 lb (55.8 kg)   LMP 02/07/2019   BMI 22.86 kg/m   Body mass index is 22.86 kg/m.  General appearance : Well developed well nourished female. No acute distress HEENT: Eyes: no retinal hemorrhage or exudates,  Neck supple, trachea midline, no carotid bruits, no thyroidmegaly Lungs: Clear to auscultation, no rhonchi or wheezes, or rib retractions  Heart: Regular rate and rhythm, no murmurs or gallops Breast:Examined in sitting and supine position were symmetrical in appearance, no palpable masses or tenderness,  no skin retraction, no nipple inversion, no nipple discharge, no skin discoloration, no axillary or supraclavicular lymphadenopathy Abdomen: no palpable masses or tenderness, no rebound or guarding Extremities: no edema or skin discoloration or tenderness  Pelvic: Vulva: Normal             Vagina: No gross lesions or discharge  Cervix: No gross lesions or discharge.  Pap reflex done.  Uterus  AV, normal size, shape and consistency, non-tender and mobile  Adnexa  Without masses or tenderness  Anus: Normal   Assessment/Plan:  [age].o. female for annual exam   1. Encounter for routine gynecological examination with Papanicolaou smear of cervix Normal gynecologic exam.  History of positive high-risk HPV, therefore Pap  test reflex done today.  Breast exam normal.  Good body mass index at 22.86.  Continue with healthy nutrition and fitness.  2. High risk human papilloma virus (HPV) infection of cervix History of HPV high-risk positive.  For that reason Pap test reflex done today.  3. Use of condoms for contraception  4. Family history of ovarian cancer Mother with history of advanced ovarian cancer at age 41,  BRCA 1 and 2 negative.   Pelvic ultrasound April 2019 was normal.  Will repeat pelvic ultrasound with annual gynecologic exam in April 2021 or earlier if patient becomes symptomatic. - US Transvaginal Non-OB; Future  Princess Bruins MD, 8:15 AM 02/19/2019

## 2019-02-19 NOTE — Patient Instructions (Signed)
1. Encounter for routine gynecological examination with Papanicolaou smear of cervix Normal gynecologic exam.  History of positive high-risk HPV, therefore Pap test reflex done today.  Breast exam normal.  Good body mass index at 22.86.  Continue with healthy nutrition and fitness.  2. High risk human papilloma virus (HPV) infection of cervix History of HPV high-risk positive.  For that reason Pap test reflex done today.  3. Use of condoms for contraception  4. Family history of ovarian cancer Mother with history of advanced ovarian cancer at age 66,  BRCA 1 and 2 negative.  Pelvic ultrasound April 2019 was normal.  Will repeat pelvic ultrasound with annual gynecologic exam in April 2021 or earlier if patient becomes symptomatic. - US Transvaginal Non-OB; Future  Charlotte Giles, it was a pleasure seeing you today!  I will inform you of your results as soon as they are available.

## 2019-02-20 LAB — PAP IG W/ RFLX HPV ASCU

## 2019-04-14 ENCOUNTER — Encounter: Payer: Self-pay | Admitting: Adult Health

## 2019-04-23 ENCOUNTER — Ambulatory Visit
Admission: RE | Admit: 2019-04-23 | Discharge: 2019-04-23 | Disposition: A | Discharge status: Home / Self Care | Payer: BC Managed Care – PPO | Source: Ambulatory Visit | Attending: Adult Health | Admitting: Adult Health

## 2019-04-23 DIAGNOSIS — K7689 Other specified diseases of liver: Secondary | ICD-10-CM | POA: Diagnosis not present

## 2019-04-23 DIAGNOSIS — K769 Liver disease, unspecified: Secondary | ICD-10-CM

## 2019-06-23 ENCOUNTER — Other Ambulatory Visit: Payer: Self-pay

## 2019-06-24 ENCOUNTER — Encounter: Payer: Self-pay | Admitting: *Deleted

## 2019-06-24 ENCOUNTER — Ambulatory Visit: Payer: BC Managed Care – PPO | Admitting: Obstetrics & Gynecology

## 2019-06-24 ENCOUNTER — Telehealth: Payer: Self-pay | Admitting: *Deleted

## 2019-06-24 ENCOUNTER — Encounter: Payer: Self-pay | Admitting: Obstetrics & Gynecology

## 2019-06-24 VITALS — BP 122/70 | Ht 61.0 in | Wt 124.0 lb

## 2019-06-24 DIAGNOSIS — N644 Mastodynia: Secondary | ICD-10-CM

## 2019-06-24 DIAGNOSIS — N6310 Unspecified lump in the right breast, unspecified quadrant: Secondary | ICD-10-CM | POA: Diagnosis not present

## 2019-06-24 DIAGNOSIS — N6011 Diffuse cystic mastopathy of right breast: Secondary | ICD-10-CM

## 2019-06-24 NOTE — Telephone Encounter (Signed)
scheduled on 07/01/19 @ 9:30am at [facility], sent my chart message.

## 2019-06-24 NOTE — Progress Notes (Signed)
    Charlotte Giles 1986-09-15 [id number]        33 y.o.  G2P2002 Married  RP: Right breast discomfort x many months  HPI: Right breast discomfort present x many months, but worsening and now constant x last few weeks.  No breast lump felt.  No nipple discharge.  No breast skin change.  Pain not worse with movements or breathing.  Menses regular normal every month.  Uses condoms for contraception.     OB History  Gravida Para Term Preterm AB Living  '2 2 2     2  '$ SAB TAB Ectopic Multiple Live Births        0 2    # Outcome Date GA Lbr Len/2nd Weight Sex Delivery Anes PTL Lv  2 Term 01/14/16 1w6d02:01 / 00:13 7 lb 7.1 oz (3.375 kg) M Vag-Spont EPI  LIV  1 Term 12/11/13 422w4d8:44 / 01:09 7 lb 8.6 oz (3.419 kg) M Vag-Spont EPI, Local  LIV    Past medical history,surgical history, problem list, medications, allergies, family history and social history were all reviewed and documented in the EPIC chart.   Directed ROS with pertinent positives and negatives documented in the history of present illness/assessment and plan.  Exam:  Vitals:   06/24/19 0807  BP: 122/70  Weight: 124 lb (56.2 kg)  Height: '5\' 1"'$  (1.549 m)   General appearance:  Normal  Breast exam:  Physical Exam  Pulmonary/Chest:       Assessment/Plan:  [age].o. G2P2002   1. Painful lumpy right breast Right lower external quadrant of the breast with tenderness and many small mobile cyst palpated.  Compatible with fibrocystic disease of the right breast.  Mother with ovarian cancer, BRCA 1 and 2 negative.  No family history of breast cancer.  Will schedule a right diagnostic mammography and ultrasound.  Rule out significant pathology of the right breast.  Precautions and recommendations for fibrocystic disease discussed with patient.  2. Fibrocystic breast changes, right As above.  Other orders - Prenatal Vit-Fe Fumarate-FA (PRENATAL MULTIVITAMIN) TABS tablet; Take 1 tablet by mouth daily at 12 noon.    Counseling on above issues and coordination of care more than 50% for 15 minutes.  MaPrincess BruinsD, 8:29 AM 06/24/2019

## 2019-06-24 NOTE — Telephone Encounter (Signed)
-----   Message from Princess Bruins, MD sent at 06/24/2019  8:41 AM EDT ----- Regarding: Schedule Rt Dx mammo/US Rt breast pain and small cysts at lower external quadrant.

## 2019-06-24 NOTE — Patient Instructions (Signed)
1. Painful lumpy right breast Right lower external quadrant of the breast with tenderness and many small mobile cyst palpated.  Compatible with fibrocystic disease of the right breast.  Mother with ovarian cancer, BRCA 1 and 2 negative.  No family history of breast cancer.  Will schedule a right diagnostic mammography and ultrasound.  Rule out significant pathology of the right breast.  Precautions and recommendations for fibrocystic disease discussed with patient.  2. Fibrocystic breast changes, right As above.  Other orders - Prenatal Vit-Fe Fumarate-FA (PRENATAL MULTIVITAMIN) TABS tablet; Take 1 tablet by mouth daily at 12 noon.  Charlotte Giles, it was a pleasure seeing you today!

## 2019-07-01 ENCOUNTER — Other Ambulatory Visit: Payer: BC Managed Care – PPO

## 2019-07-03 ENCOUNTER — Ambulatory Visit
Admission: RE | Admit: 2019-07-03 | Discharge: 2019-07-03 | Disposition: A | Discharge status: Home / Self Care | Payer: BC Managed Care – PPO | Source: Ambulatory Visit | Attending: Obstetrics & Gynecology | Admitting: Obstetrics & Gynecology

## 2019-07-03 ENCOUNTER — Other Ambulatory Visit: Payer: Self-pay

## 2019-07-03 DIAGNOSIS — N6310 Unspecified lump in the right breast, unspecified quadrant: Secondary | ICD-10-CM

## 2019-07-03 DIAGNOSIS — N6011 Diffuse cystic mastopathy of right breast: Secondary | ICD-10-CM

## 2019-07-03 DIAGNOSIS — N644 Mastodynia: Secondary | ICD-10-CM

## 2019-07-03 DIAGNOSIS — N6489 Other specified disorders of breast: Secondary | ICD-10-CM | POA: Diagnosis not present

## 2019-07-03 DIAGNOSIS — R922 Inconclusive mammogram: Secondary | ICD-10-CM | POA: Diagnosis not present

## 2019-09-08 ENCOUNTER — Other Ambulatory Visit: Payer: Self-pay

## 2019-09-08 DIAGNOSIS — Z20822 Contact with and (suspected) exposure to covid-19: Secondary | ICD-10-CM

## 2019-09-10 LAB — NOVEL CORONAVIRUS, NAA: SARS-CoV-2, NAA: NOT DETECTED

## 2019-10-08 ENCOUNTER — Encounter: Payer: Self-pay | Admitting: Obstetrics & Gynecology

## 2019-10-08 ENCOUNTER — Ambulatory Visit: Payer: BC Managed Care – PPO | Admitting: Obstetrics & Gynecology

## 2019-10-08 ENCOUNTER — Other Ambulatory Visit: Payer: Self-pay

## 2019-10-08 VITALS — BP 122/80

## 2019-10-08 DIAGNOSIS — Z113 Encounter for screening for infections with a predominantly sexual mode of transmission: Secondary | ICD-10-CM

## 2019-10-08 DIAGNOSIS — Z789 Other specified health status: Secondary | ICD-10-CM | POA: Diagnosis not present

## 2019-10-08 DIAGNOSIS — R350 Frequency of micturition: Secondary | ICD-10-CM

## 2019-10-08 DIAGNOSIS — R102 Pelvic and perineal pain: Secondary | ICD-10-CM | POA: Diagnosis not present

## 2019-10-08 LAB — URINALYSIS, COMPLETE W/RFL CULTURE
Bacteria, UA: NONE SEEN /HPF
Bilirubin Urine: NEGATIVE
Glucose, UA: NEGATIVE
Hgb urine dipstick: NEGATIVE
Hyaline Cast: NONE SEEN /LPF
Ketones, ur: NEGATIVE
Leukocyte Esterase: NEGATIVE
Nitrites, Initial: NEGATIVE
Protein, ur: NEGATIVE
RBC / HPF: NONE SEEN /HPF (ref 0–2)
Specific Gravity, Urine: 1.02 (ref 1.001–1.03)
WBC, UA: NONE SEEN /HPF (ref 0–5)
pH: 7 (ref 5.0–8.0)

## 2019-10-08 LAB — NO CULTURE INDICATED

## 2019-10-08 NOTE — Patient Instructions (Signed)
1. Female pelvic pain Probably ovulatory pain, improving today.  Completely normal gynecologic exam today.  Urine analysis completely negative.  Pending gonorrhea and chlamydia.  Will use ibuprofen 600 mg every 6 hours as needed.  Offered a pelvic ultrasound but patient prefers observation.  Will call back to reevaluate if worsening pain.  2. Urinary frequency U/A completely negative. - Urinalysis,Complete w/RFL Culture  3. Screen for STD (sexually transmitted disease) - C. trachomatis/N. gonorrhoeae RNA  4. Use of condoms for contraception  Susi, it was a pleasure seeing you today!  I will inform you of your results as soon as they are available.

## 2019-10-08 NOTE — Progress Notes (Signed)
    Charlotte Giles Nov 01, 1985 [id number]        33 y.o.  G2P2002 Married  RP: Pelvic pain x 2 days  HPI: Menstrual periods every month, normal flow.  No BTB.  Using condoms for contraception.  LMP normal 09/22/2019.  Started having pelvic pain at midcycle on day #14, 2 days ago.  The pain is improving today.  Didn't take any medication for pain.  No abnormal vaginal discharge.  No fever.  Urine/BMs normal.   OB History  Gravida Para Term Preterm AB Living  2 2 2    0 2  SAB TAB Ectopic Multiple Live Births      0 0 2    # Outcome Date GA Lbr Len/2nd Weight Sex Delivery Anes PTL Lv  2 Term 01/14/16 [redacted]w[redacted]d 02:01 / 00:13 7 lb 7.1 oz (3.375 kg) M Vag-Spont EPI  LIV  1 Term 12/11/13 [redacted]w[redacted]d 08:44 / 01:09 7 lb 8.6 oz (3.419 kg) M Vag-Spont EPI, Local  LIV    Past medical history,surgical history, problem list, medications, allergies, family history and social history were all reviewed and documented in the EPIC chart.   Directed ROS with pertinent positives and negatives documented in the history of present illness/assessment and plan.  Exam:  Vitals:   10/08/19 1544  BP: 122/80   General appearance:  Normal  CVAT Negative bilaterally  Abdomen: Soft, NT, not distended.  Gynecologic exam: Vulva normal.  Speculum:  Cervix/Vagina normal.  Normal secretions, no blood.  Gono-Chlam done on cervix.  Bimanual exam:  Uterus AV, normal volume, mobile, NT.  No adnexal mass, NT bilaterally.  U/A completely negative   Assessment/Plan:  33 y.o. G2P2002   1. Female pelvic pain Probably ovulatory pain, improving today.  Completely normal gynecologic exam today.  Urine analysis completely negative.  Pending gonorrhea and chlamydia.  Will use ibuprofen 600 mg every 6 hours as needed.  Offered a pelvic ultrasound but patient prefers observation.  Will call back to reevaluate if worsening pain.  2. Urinary frequency U/A completely negative. - Urinalysis,Complete w/RFL Culture  3. Screen for  STD (sexually transmitted disease) - C. trachomatis/N. gonorrhoeae RNA  4. Use of condoms for contraception  Counseling on above issues and coordination of care more than 50% for 25 minutes.  Princess Bruins MD, 3:59 PM 10/08/2019

## 2019-10-09 ENCOUNTER — Encounter: Payer: Self-pay | Admitting: *Deleted

## 2019-10-09 LAB — CHLAMYDIA PROBE AMP THINPREP: C. trachomatis RNA, TMA: NOT DETECTED

## 2019-10-09 LAB — GC PROBE AMP THINPREP: N. gonorrhoeae RNA, TMA: NOT DETECTED

## 2019-12-17 ENCOUNTER — Other Ambulatory Visit: Payer: Self-pay | Admitting: *Deleted

## 2019-12-17 DIAGNOSIS — R102 Pelvic and perineal pain: Secondary | ICD-10-CM

## 2020-01-28 ENCOUNTER — Other Ambulatory Visit: Payer: Self-pay

## 2020-01-29 ENCOUNTER — Encounter: Payer: Self-pay | Admitting: Adult Health

## 2020-01-29 ENCOUNTER — Ambulatory Visit (INDEPENDENT_AMBULATORY_CARE_PROVIDER_SITE_OTHER): Payer: BC Managed Care – PPO | Admitting: Adult Health

## 2020-01-29 VITALS — BP 100/72 | Temp 98.2°F | Ht 61.0 in | Wt 124.0 lb

## 2020-01-29 DIAGNOSIS — Z91018 Allergy to other foods: Secondary | ICD-10-CM

## 2020-01-29 DIAGNOSIS — Z0000 Encounter for general adult medical examination without abnormal findings: Secondary | ICD-10-CM | POA: Diagnosis not present

## 2020-01-29 DIAGNOSIS — K2100 Gastro-esophageal reflux disease with esophagitis, without bleeding: Secondary | ICD-10-CM | POA: Diagnosis not present

## 2020-01-29 LAB — TSH: TSH: 1.62 u[IU]/mL (ref 0.35–4.50)

## 2020-01-29 LAB — COMPREHENSIVE METABOLIC PANEL
ALT: 12 U/L (ref 0–35)
AST: 16 U/L (ref 0–37)
Albumin: 4.3 g/dL (ref 3.5–5.2)
Alkaline Phosphatase: 40 U/L (ref 39–117)
BUN: 15 mg/dL (ref 6–23)
CO2: 28 mEq/L (ref 19–32)
Calcium: 9.5 mg/dL (ref 8.4–10.5)
Chloride: 103 mEq/L (ref 96–112)
Creatinine, Ser: 0.96 mg/dL (ref 0.40–1.20)
GFR: 80.94 mL/min (ref >60.00)
Glucose, Bld: 87 mg/dL (ref 70–99)
Potassium: 4 mEq/L (ref 3.5–5.1)
Sodium: 138 mEq/L (ref 135–145)
Total Bilirubin: 0.9 mg/dL (ref 0.2–1.2)
Total Protein: 6.8 g/dL (ref 6.0–8.3)

## 2020-01-29 LAB — CBC WITH DIFFERENTIAL/PLATELET
Basophils Absolute: 0 10*3/uL (ref 0.0–0.1)
Basophils Relative: 0.3 % (ref 0.0–3.0)
Eosinophils Absolute: 0.1 10*3/uL (ref 0.0–0.7)
Eosinophils Relative: 1.3 % (ref 0.0–5.0)
HCT: 36.8 % (ref 36.0–46.0)
Hemoglobin: 12.3 g/dL (ref 12.0–15.0)
Lymphocytes Relative: 35.4 % (ref 12.0–46.0)
Lymphs Abs: 2.5 10*3/uL (ref 0.7–4.0)
MCHC: 33.4 g/dL (ref 30.0–36.0)
MCV: 89.6 fl (ref 78.0–100.0)
Monocytes Absolute: 0.6 10*3/uL (ref 0.1–1.0)
Monocytes Relative: 8.4 % (ref 3.0–12.0)
Neutro Abs: 3.9 10*3/uL (ref 1.4–7.7)
Neutrophils Relative %: 54.6 % (ref 43.0–77.0)
Platelets: 246 10*3/uL (ref 150.0–400.0)
RBC: 4.11 Mil/uL (ref 3.87–5.11)
RDW: 13.3 % (ref 11.5–15.5)
WBC: 7.1 10*3/uL (ref 4.0–10.5)

## 2020-01-29 LAB — LIPID PANEL
Cholesterol: 145 mg/dL (ref 0–200)
HDL: 46.3 mg/dL (ref >39.00)
LDL Cholesterol: 91 mg/dL (ref 0–99)
NonHDL: 98.24
Total CHOL/HDL Ratio: 3
Triglycerides: 38 mg/dL (ref 0.0–149.0)
VLDL: 7.6 mg/dL (ref 0.0–40.0)

## 2020-01-29 NOTE — Progress Notes (Signed)
Subjective:    Patient ID: Charlotte Giles, female    DOB: [birth year], 34 y.o.   MRN: [id number]  HPI Patient presents for yearly preventative medicine examination. 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  Is started with peanut butter at which time she experienced nausea and diarrhea as well as feeling of tightening and itching of the throat.  She switched peanut butter brands but still had some nausea.  She is also noticed that eggs causes nausea and vomiting.  She has tried to stay away from these foods but continues to have episodes of nausea after some meals.  She does endorse feeling of throat irritation.  She denies waking up with a sour taste in her mouth or abdominal pain.  All immunizations and health maintenance protocols were reviewed with the patient and needed orders were placed.  Appropriate screening laboratory values were ordered for the patient including screening of hyperlipidemia, renal function and hepatic function.  Medication reconciliation,  past medical history, social history, problem list and allergies were reviewed in detail with the patient  Goals were established with regard to weight loss, exercise, and  diet in compliance with medications. She is eating healthy and exercising  Wt Readings from Last 3 Encounters:  01/29/20 124 lb (56.2 kg)  06/24/19 124 lb (56.2 kg)  02/19/19 123 lb (55.8 kg)    Review of Systems  Constitutional: Negative.   HENT: Negative.   Eyes: Negative.   Respiratory: Negative.   Cardiovascular: Negative.    Gastrointestinal: Positive for diarrhea, nausea and vomiting.  Endocrine: Negative.   Genitourinary: Negative.   Musculoskeletal: Negative.   Skin: Negative.   Allergic/Immunologic: Negative.   Neurological: Negative.   Hematological: Negative.   Psychiatric/Behavioral: Negative.    Past Medical History:  Diagnosis Date  . Acute blood loss anemia 12/12/2013  . Anemia   . 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 HSV infection     Social History   Socioeconomic History  . Marital status: Married    Spouse name: Not on file  . Number of children: Not on file  . Years of education: Not on file  . Highest education level: Not on file  Occupational History  . Occupation: nurse  Tobacco Use  . Smoking status: Never Smoker  . Smokeless tobacco: Never Used  Substance and Sexual Activity  . Alcohol use: Yes    Alcohol/week: 0.0 standard drinks    Comment: social   . Drug use: No  . Sexual activity: Yes    Partners: Male    Birth control/protection: Condom    Comment: 1st intercourse- 18, partners- 2, married- 7 yrs   Other Topics Concern  . Not on file  Social History Narrative   Nurse in Medical ICU - RN    Two children - 4 and 2    Married - 6 years       Social Determinants of Radio broadcast assistant Strain:   . Difficulty of Paying Living Expenses:   Food  Insecurity:   . Worried About Programme researcher, broadcasting/film/video in the Last Year:   . Barista in the Last Year:   Transportation Needs:   . Freight forwarder (Medical):   Marland Kitchen Lack of Transportation (Non-Medical):   Physical Activity:   . Days of Exercise per Week:   . Minutes of Exercise per Session:   Stress:   . Feeling of Stress :   Social Connections:   . Frequency of Communication with Friends and Family:   . Frequency of Social Gatherings with Friends and Family:   . Attends Religious Services:   . Active Member of Clubs or Organizations:   . Attends Tax inspector Meetings:   Marland Kitchen Marital Status:   Intimate Partner Violence:   . Fear of Current or Ex-Partner:   . Emotionally Abused:   Marland Kitchen Physically Abused:   . Sexually Abused:     Past Surgical History:  Procedure Laterality Date  . HERNIA REPAIR    . LAPAROSCOPIC APPENDECTOMY N/A 02/25/2018   Procedure: APPENDECTOMY LAPAROSCOPIC;  Surgeon: Manus Rudd, MD;  Location: [facility] OR;  Service: General;  Laterality: N/A;    Family History  Problem Relation Age of Onset  . Hypertension Mother   . Ovarian cancer Mother   . Cancer Father 66       PROSTATE  . Diabetes Maternal Grandmother   . Hypertension Maternal Grandmother   . Sudden Cardiac Death Maternal Uncle 37       MI   . Breast cancer Neg Hx     No Known Allergies  Current Outpatient Medications on File Prior to Visit  Medication Sig Dispense Refill  . acetaminophen (TYLENOL) 500 MG tablet Take 500-1,000 mg by mouth every 8 (eight) hours as needed (for headaches).     Marland Kitchen ibuprofen (ADVIL,MOTRIN) 200 MG tablet Take 600 mg by mouth every 6 (six) hours as needed (for headaches).     . Prenatal Vit-Fe Fumarate-FA (PRENATAL MULTIVITAMIN) TABS tablet Take 1 tablet by mouth daily at 12 noon.     No current facility-administered medications on file prior to visit.    BP 100/72   Temp 98.2 F (36.8 C)   Ht 5\' 1"  (1.549 m)   Wt 124 lb (56.2 kg)   BMI 23.43 kg/m       Objective:   Physical Exam Vitals and nursing note reviewed.  Constitutional:      General: She is not in acute distress.    Appearance: Normal appearance. She is well-developed. She is not ill-appearing.  HENT:     Head: Normocephalic and atraumatic.     Right Ear: Tympanic membrane, ear canal and external ear normal. There is no impacted cerumen.     Left Ear: Tympanic membrane, ear canal and external ear normal. There is no impacted cerumen.     Nose: Nose normal. No congestion or rhinorrhea.     Mouth/Throat:     Mouth: Mucous membranes are moist.      Pharynx: Oropharynx is clear. No oropharyngeal exudate or posterior oropharyngeal erythema.  Eyes:     General:        Right eye: No discharge.        Left eye: No discharge.     Extraocular Movements: Extraocular movements intact.     Conjunctiva/sclera: Conjunctivae normal.     Pupils: Pupils are equal, round, and reactive to light.  Neck:     Thyroid: No thyromegaly.     Vascular: No  carotid bruit.     Trachea: No tracheal deviation.  Cardiovascular:     Rate and Rhythm: Normal rate and regular rhythm.     Pulses: Normal pulses.     Heart sounds: Normal heart sounds. No murmur. No friction rub. No gallop.   Pulmonary:     Effort: Pulmonary effort is normal. No respiratory distress.     Breath sounds: Normal breath sounds. No stridor. No wheezing, rhonchi or rales.  Chest:     Chest wall: No tenderness.  Abdominal:     General: Abdomen is flat. Bowel sounds are normal. There is no distension.     Palpations: Abdomen is soft. There is no mass.     Tenderness: There is no abdominal tenderness. There is no right CVA tenderness, left CVA tenderness, guarding or rebound.     Hernia: No hernia is present.  Musculoskeletal:        General: No swelling, tenderness, deformity or signs of injury. Normal range of motion.     Cervical back: Normal range of motion and neck supple.     Right lower leg: No edema.     Left lower leg: No edema.  Lymphadenopathy:     Cervical: No cervical adenopathy.  Skin:    General: Skin is warm and dry.     Coloration: Skin is not jaundiced or pale.     Findings: No bruising, erythema, lesion or rash.  Neurological:     General: No focal deficit present.     Mental Status: She is alert and oriented to person, place, and time.     Cranial Nerves: No cranial nerve deficit.     Sensory: No sensory deficit.     Motor: No weakness.     Coordination: Coordination normal.     Gait: Gait normal.     Deep Tendon Reflexes: Reflexes normal.  Psychiatric:         Mood and Affect: Mood normal.        Behavior: Behavior normal.        Thought Content: Thought content normal.        Judgment: Judgment normal.       Assessment & Plan:  1. Routine general medical examination at a health care facility -Benign exam.  Continue to eat healthy and exercise.  Follow-up in 1 year or sooner if needed - CBC with Differential/Platelet - Comprehensive metabolic panel - Lipid panel - TSH  2. Gastroesophageal reflux disease with esophagitis without hemorrhage -Possibly some acid reflux.  She was advised to try Prilosec daily for the next 2 to 3 weeks and then stop.  If symptoms do not resolve then she was advised to follow-up  3. 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  Her son does have a peanut allergy.  She was advised to follow-up with low-power allergy and asthma  Shirline Frees, NP

## 2020-01-29 NOTE — Patient Instructions (Signed)
It was great seeing you today   We will follow up with you regarding your blood work   Try Prilosec for the next 2-3 weeks to see if your symptoms resolve; if not, please let me know know

## 2020-02-19 ENCOUNTER — Other Ambulatory Visit: Payer: Self-pay

## 2020-02-22 ENCOUNTER — Encounter: Payer: Self-pay | Admitting: Obstetrics & Gynecology

## 2020-02-22 ENCOUNTER — Ambulatory Visit (INDEPENDENT_AMBULATORY_CARE_PROVIDER_SITE_OTHER): Payer: BC Managed Care – PPO | Admitting: Obstetrics & Gynecology

## 2020-02-22 ENCOUNTER — Ambulatory Visit (INDEPENDENT_AMBULATORY_CARE_PROVIDER_SITE_OTHER): Payer: BC Managed Care – PPO

## 2020-02-22 ENCOUNTER — Other Ambulatory Visit: Payer: Self-pay

## 2020-02-22 VITALS — BP 120/74 | Ht 61.5 in | Wt 122.0 lb

## 2020-02-22 DIAGNOSIS — R102 Pelvic and perineal pain: Secondary | ICD-10-CM | POA: Diagnosis not present

## 2020-02-22 DIAGNOSIS — N854 Malposition of uterus: Secondary | ICD-10-CM

## 2020-02-22 DIAGNOSIS — Z01419 Encounter for gynecological examination (general) (routine) without abnormal findings: Secondary | ICD-10-CM

## 2020-02-22 DIAGNOSIS — N83202 Unspecified ovarian cyst, left side: Secondary | ICD-10-CM | POA: Diagnosis not present

## 2020-02-22 DIAGNOSIS — Z789 Other specified health status: Secondary | ICD-10-CM

## 2020-02-22 DIAGNOSIS — Z8041 Family history of malignant neoplasm of ovary: Secondary | ICD-10-CM

## 2020-02-22 NOTE — Progress Notes (Signed)
Charlotte Giles 29-Jan-1986 [id number]   History:    34 y.o. G2P2L2 married.  2 sons 4 and 71 yo.  RP:  Established patient presenting for annual gyn exam and Pelvic US for Fam h/o Ovarian Ca  HPI: Menses every month with normal flow.  No breakthrough bleeding.  Using condoms for contraception.  Recurrent luteal pelvic discomfort.  No pain with intercourse.  Urine and bowel movements normal.  Breasts normal.  Family history of ovarian cancer.  Health labs with family physician.  Past medical history,surgical history, family history and social history were all reviewed and documented in the EPIC chart.  Gynecologic History Patient's last menstrual period was 02/05/2020.  Obstetric History OB History  Gravida Para Term Preterm AB Living  2 2 2    0 2  SAB TAB Ectopic Multiple Live Births      0 0 2    # Outcome Date GA Lbr Len/2nd Weight Sex Delivery Anes PTL Lv  2 Term 01/14/16 108w6d 02:01 / 00:13 7 lb 7.1 oz (3.375 kg) M Vag-Spont EPI  LIV  1 Term 12/11/13 [redacted]w[redacted]d 08:44 / 01:09 7 lb 8.6 oz (3.419 kg) M Vag-Spont EPI, Local  LIV     ROS: A ROS was performed and pertinent positives and negatives are included in the history.  GENERAL: No fevers or chills. HEENT: No change in vision, no earache, sore throat or sinus congestion. NECK: No pain or stiffness. CARDIOVASCULAR: No chest pain or pressure. No palpitations. PULMONARY: No shortness of breath, cough or wheeze. GASTROINTESTINAL: No abdominal pain, nausea, vomiting or diarrhea, melena or bright red blood per rectum. GENITOURINARY: No urinary frequency, urgency, hesitancy or dysuria. MUSCULOSKELETAL: No joint or muscle pain, no back pain, no recent trauma. DERMATOLOGIC: No rash, no itching, no lesions. ENDOCRINE: No polyuria, polydipsia, no heat or cold intolerance. No recent change in weight. HEMATOLOGICAL: No anemia or easy bruising or bleeding. NEUROLOGIC: No headache, seizures, numbness, tingling or weakness. PSYCHIATRIC: No  depression, no loss of interest in normal activity or change in sleep pattern.     Exam:   BP 120/74   Ht 5' 1.5" (1.562 m)   Wt 122 lb (55.3 kg)   LMP 02/05/2020   BMI 22.68 kg/m   Body mass index is 22.68 kg/m.  General appearance : Well developed well nourished female. No acute distress HEENT: Eyes: no retinal hemorrhage or exudates,  Neck supple, trachea midline, no carotid bruits, no thyroidmegaly Lungs: Clear to auscultation, no rhonchi or wheezes, or rib retractions  Heart: Regular rate and rhythm, no murmurs or gallops Breast:Examined in sitting and supine position were symmetrical in appearance, no palpable masses or tenderness,  no skin retraction, no nipple inversion, no nipple discharge, no skin discoloration, no axillary or supraclavicular lymphadenopathy Abdomen: no palpable masses or tenderness, no rebound or guarding Extremities: no edema or skin discoloration or tenderness  Pelvic: Vulva: Normal             Vagina: No gross lesions or discharge  Cervix: No gross lesions or discharge.  Pap reflex done.  Uterus  AV, normal size, shape and consistency, non-tender and mobile  Adnexa  Without masses or tenderness  Anus: Normal  Pelvic 02/07/2020 today: T/V images.  Anteverted uterus normal in size and shape with no myometrial mass.  The uterus is measured at 8.44 x 4.19 x 4 cm.  The endometrial lining is secretory in appearance, symmetrical measured at 9.66 mm with no mass or thickening seen.  Right  ovary normal.  Left ovary with a 4.8 x 2.6 cm cyst with debris's, probably hemorrhagic cyst which was mildly tender to palpation.  Small amount of free fluid in the posterior cul-de-sac.   Assessment/Plan:  34 y.o. female for annual exam   1. Encounter for routine gynecological examination with Papanicolaou smear of cervix Normal gynecologic exam.  Pap reflex done.  Breast exam normal.  Body mass index good at 22.68.  Continue with fitness and healthy nutrition.  Health labs with  family physician.  2. Use of condoms for contraception  3. Family history of ovarian cancer Pelvic ultrasound reassuring with a normal right ovary and a probable hemorrhagic cyst on the left ovary measured at 4.8 x 2.6 cm.  Uterus and endometrium normal.  Patient reassured.  4. Female pelvic pain Possible pain associated with a left hemorrhagic cyst.  Will observe.  Precautions discussed.  Princess Bruins MD, 9:07 AM 02/22/2020

## 2020-02-24 LAB — PAP IG W/ RFLX HPV ASCU

## 2020-02-28 ENCOUNTER — Encounter: Payer: Self-pay | Admitting: Obstetrics & Gynecology

## 2020-02-28 NOTE — Patient Instructions (Signed)
1. Encounter for routine gynecological examination with Papanicolaou smear of cervix Normal gynecologic exam.  Pap reflex done.  Breast exam normal.  Body mass index good at 22.68.  Continue with fitness and healthy nutrition.  Health labs with family physician.  2. Use of condoms for contraception  3. Family history of ovarian cancer Pelvic ultrasound reassuring with a normal right ovary and a probable hemorrhagic cyst on the left ovary measured at 4.8 x 2.6 cm.  Uterus and endometrium normal.  Patient reassured.  4. Female pelvic pain Possible pain associated with a left hemorrhagic cyst.  Will observe.  Precautions discussed.  Charlotte Giles, it was a pleasure seeing you today!  I will inform you of your results as soon as they are available.

## 2020-04-11 DIAGNOSIS — Z03818 Encounter for observation for suspected exposure to other biological agents ruled out: Secondary | ICD-10-CM | POA: Diagnosis not present

## 2020-04-11 DIAGNOSIS — Z20822 Contact with and (suspected) exposure to covid-19: Secondary | ICD-10-CM | POA: Diagnosis not present

## 2020-04-12 DIAGNOSIS — T781XXD Other adverse food reactions, not elsewhere classified, subsequent encounter: Secondary | ICD-10-CM | POA: Diagnosis not present

## 2020-04-15 DIAGNOSIS — T781XXA Other adverse food reactions, not elsewhere classified, initial encounter: Secondary | ICD-10-CM | POA: Diagnosis not present

## 2020-04-27 IMAGING — MR MR ABDOMEN W/O CM
7 of 8 series · 33 of 48 positions shown · IV contrast (agent unspecified)
Comparison: 02/25/2018

CLINICAL DATA: Indeterminate liver mass for further
characterization.

EXAM:
MRI ABDOMEN WITHOUT CONTRAST
TECHNIQUE: Multiplanar multisequence MR imaging was performed without the
administration of intravenous contrast.
Multiplanar multisequence MR imaging of the abdomen and pelvis was
performed. No intravenous contrast was administered. The exam was
requested with IV contrast, but the patient refused IV contrast.

[Series 3: cor haste · coronal · 5.0mm · 0.74mm/px · 2 of 27 slices shown]
[im 1/27]
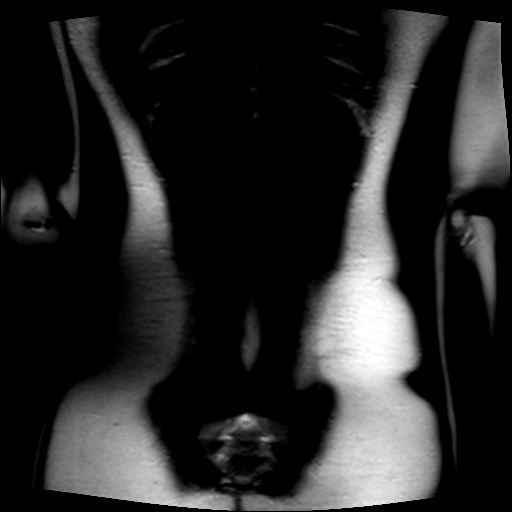
[im 27/27]
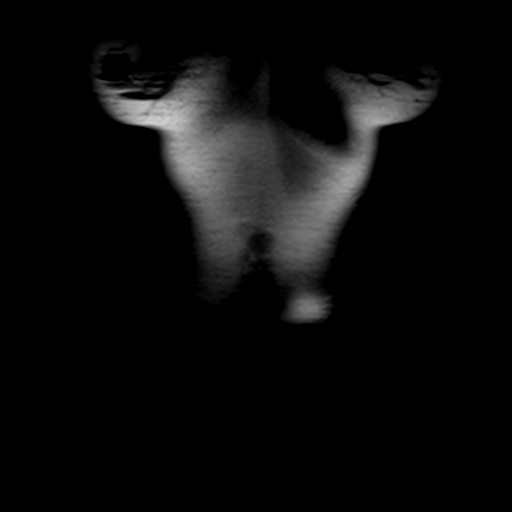

[Series 4: T1 · axial · 6.0mm · 0.74mm/px · z∈[-64,+134]mm · 7 of 62 slices shown]
[im 1/62]
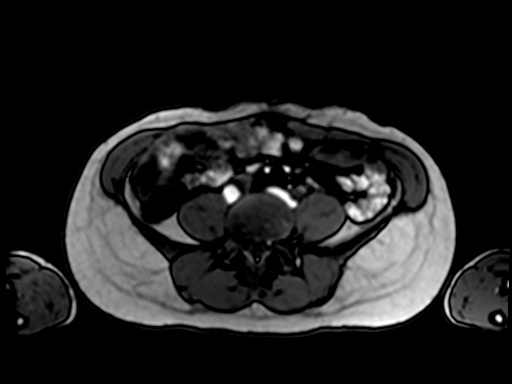
[im 11/62]
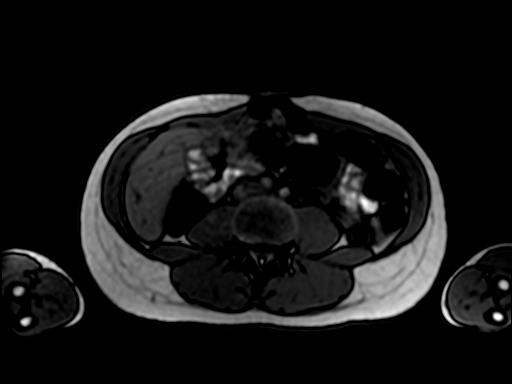
[im 21/62]
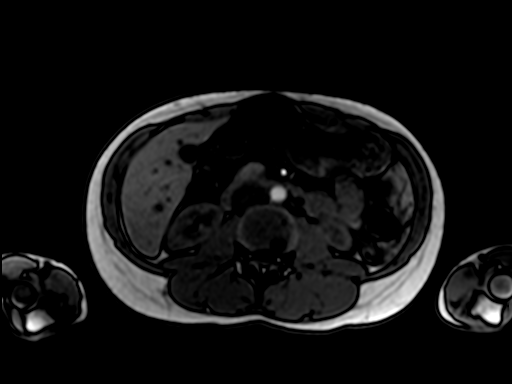
[im 31/62]
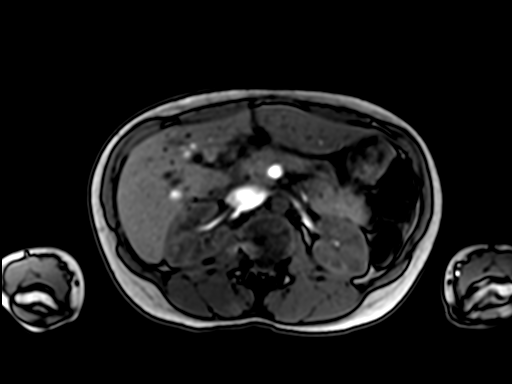
[im 41/62]
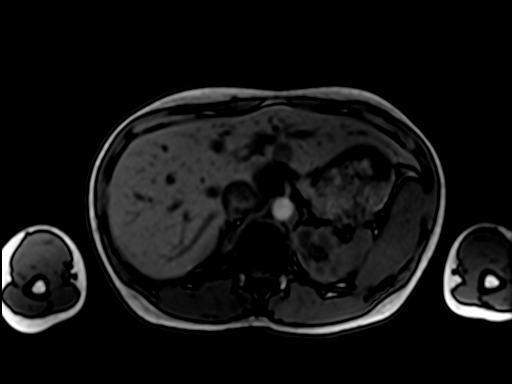
[im 51/62]
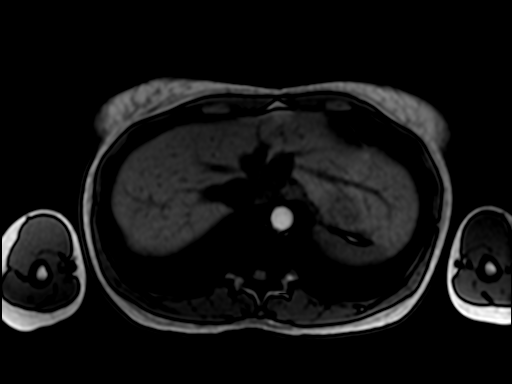
[im 62/62]
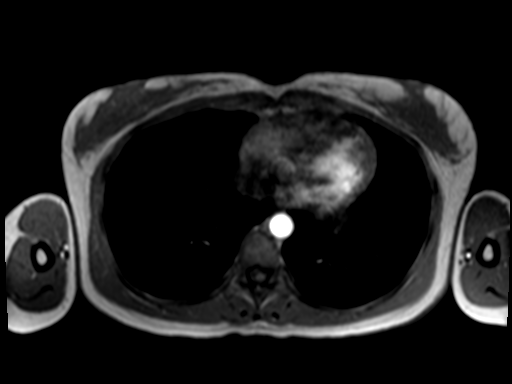

[Series 5: axial haste · axial · 6.0mm · 0.74mm/px · z∈[-70,+141]mm · 4 of 33 slices shown]
[im 1/33]
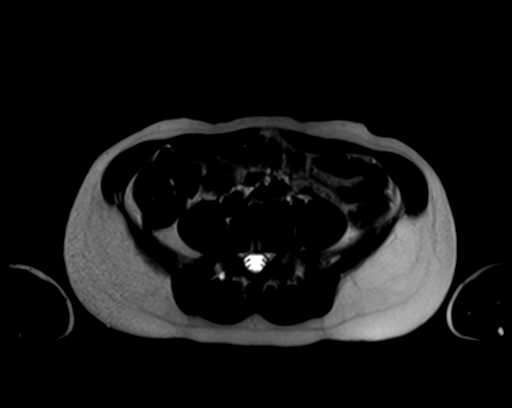
[im 11/33]
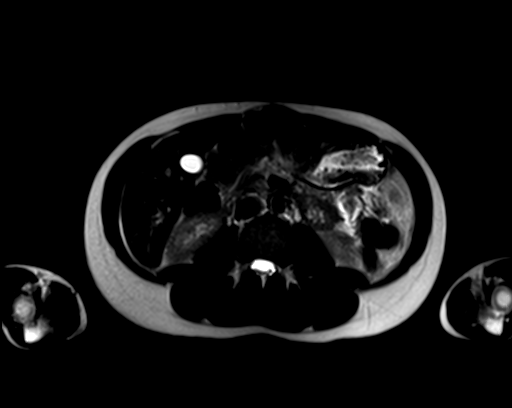
[im 22/33]
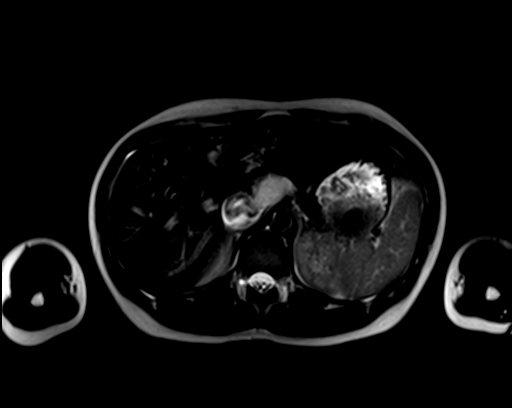
[im 33/33]
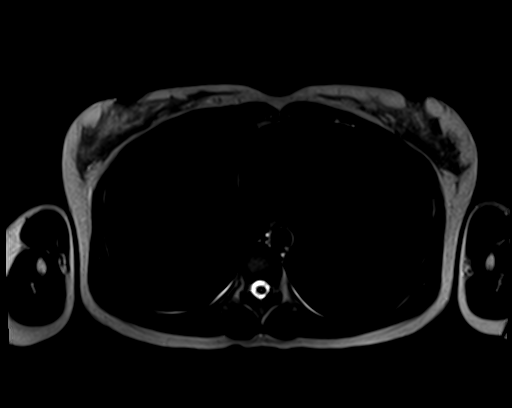

[Series 6: T2 · axial · 6.0mm · 1.12mm/px · z∈[-52,+193]mm · 4 of 35 slices shown]
[im 1/35]
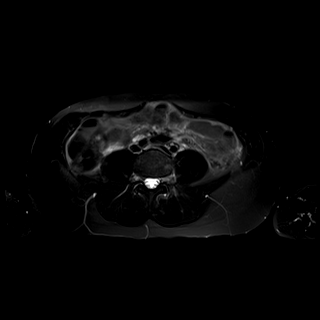
[im 12/35]
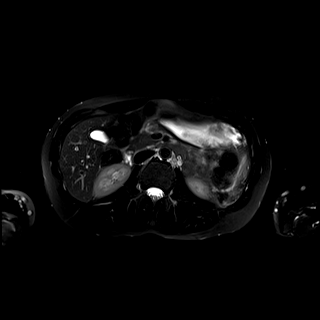
[im 23/35]
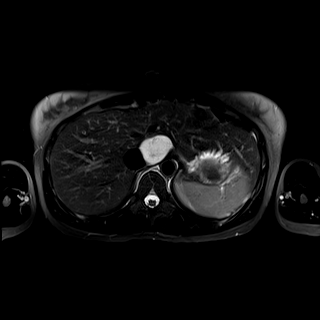
[im 35/35]
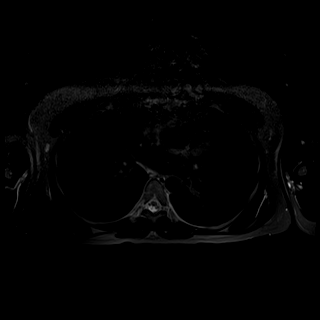

[Series 7: ep2d_diff_b50_500_800_p2_trig · axial · 6.0mm · 1.98mm/px · z∈[-53,+178]mm · 8 of 99 slices shown]
[im 1/99]
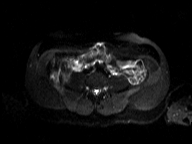
[im 20/99]
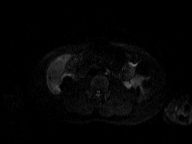
[im 30/99]
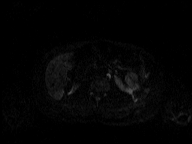
[im 40/99]
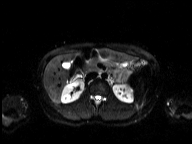
[im 59/99]
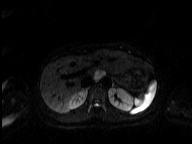
[im 69/99]
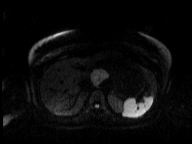
[im 79/99]
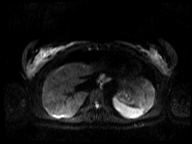
[im 99/99]
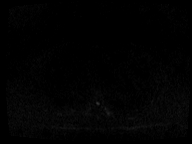

[Series 8: ep2d_diff_b50_500_800_p2_trig_adc · axial · 6.0mm · 1.98mm/px · z∈[-53,+178]mm · 4 of 33 slices shown]
[im 1/33]
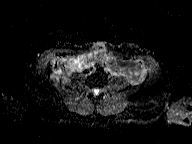
[im 11/33]
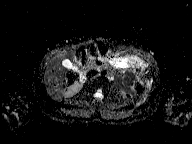
[im 22/33]
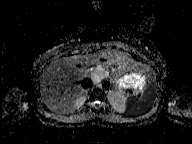
[im 33/33]
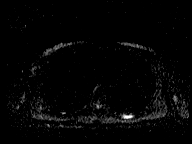

[Series 9: bSSFP · axial · 4.0mm · 0.74mm/px · z∈[-65,+55]mm · 4 of 51 slices shown]
[im 1/51]
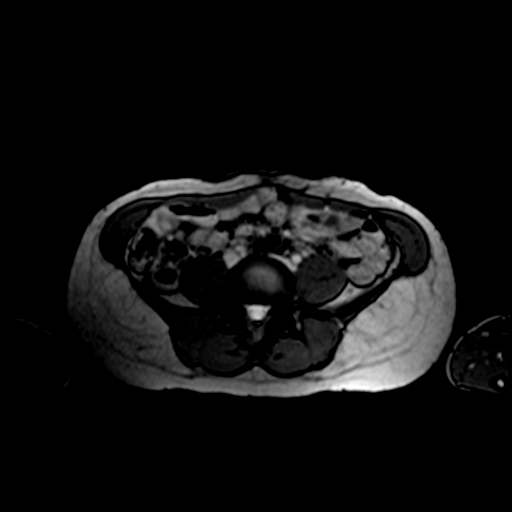
[im 11/51]
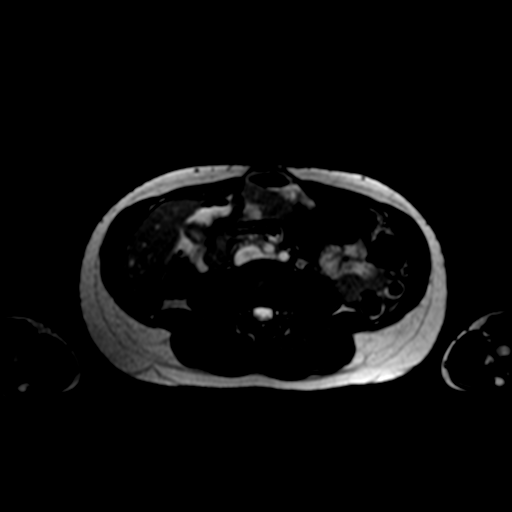
[im 21/51]
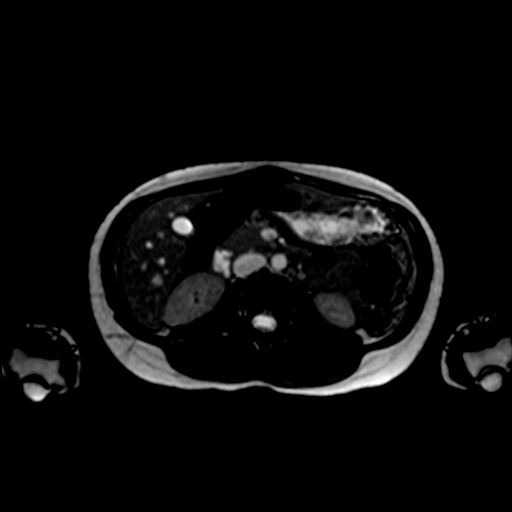
[im 31/51]
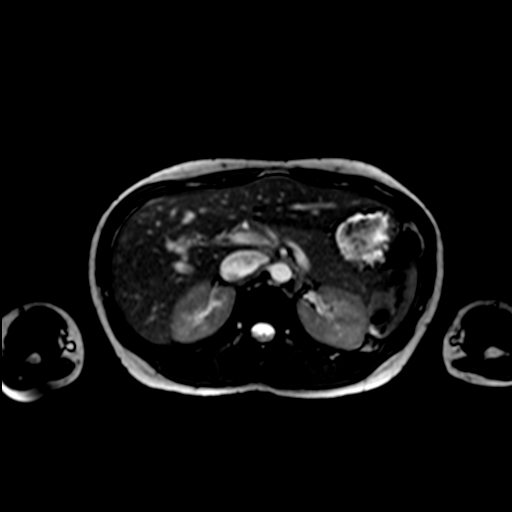

[33 of 48 positions shown; findings below may reference images not displayed]

FINDINGS: Lower chest: Unremarkable

Hepatobiliary: Posteriorly in the left hepatic lobe there is a
by 2.9 by 5.3 cm mass with T2 signal hyperintensity and low T1
signal. On the patient's prior CT this demonstrated some peripheral
nodular enhancement, but of course we do not have contrast on
today's MRI to further assess. I am skeptical that the lesion is a
simple cyst based on the T2 signal which, although high, is not as
high as simple fluid. No signal dropout in this lesion on out of
phase images.

Gallbladder unremarkable.  No biliary dilatation.

Pancreas:  Unremarkable

Spleen:  Unremarkable

Adrenals/Urinary Tract:  Unremarkable

Stomach/Bowel: Unremarkable

Vascular/Lymphatic:  Unremarkable

Other: On coronal images the possibility of a complex right adnexal
cystic lesion is raised for example on image [DATE], but the pelvis
was only included on that single coronal series and not on the axial
images which were focused on the liver.

Musculoskeletal: Unremarkable
IMPRESSION: 1. T2 hyperintense lesion posteriorly in the left hepatic lobe
corresponds to the lesion seen on CT. The imaging appearance today
is nonspecific due to the lack of IV contrast (the patient refused
contrast), although the preponderance of evidence based on
peripheral nodular enhancement on the CT would tend to favor
hemangioma. Given the patient's refusal for IV contrast, I would
suggest a follow up ultrasound in 3-6 months in order to assess this
lesion for the typical hyperechoic appearance of hemangioma at
ultrasound. If indeed the lesion appears uniformly hyperechoic then
that would be further reassuring that this is not a malignancy.
2. Complex cystic lesion in the right adnexa. If the patient has
symptoms referable to the pelvis, then pelvic sonography could be
utilized for further characterization.

## 2020-07-14 ENCOUNTER — Other Ambulatory Visit: Payer: Self-pay

## 2020-07-14 DIAGNOSIS — M545 Low back pain, unspecified: Secondary | ICD-10-CM

## 2020-07-14 NOTE — Telephone Encounter (Signed)
Per Sherrilyn Rist okay to schedule for next Thursday and work patient in with TW to follow up on result.  If she feels like she cannot wait until next week then recommend to ER.  Lupita Leash will relay to patient and schedule accordingly.

## 2020-07-21 ENCOUNTER — Other Ambulatory Visit: Payer: BC Managed Care – PPO

## 2020-07-21 ENCOUNTER — Ambulatory Visit: Payer: BC Managed Care – PPO | Admitting: Nurse Practitioner

## 2020-08-09 ENCOUNTER — Ambulatory Visit (INDEPENDENT_AMBULATORY_CARE_PROVIDER_SITE_OTHER): Payer: BC Managed Care – PPO

## 2020-08-09 ENCOUNTER — Encounter: Payer: Self-pay | Admitting: Nurse Practitioner

## 2020-08-09 ENCOUNTER — Other Ambulatory Visit: Payer: Self-pay

## 2020-08-09 ENCOUNTER — Ambulatory Visit: Payer: BC Managed Care – PPO | Admitting: Nurse Practitioner

## 2020-08-09 DIAGNOSIS — M545 Low back pain, unspecified: Secondary | ICD-10-CM

## 2020-08-09 DIAGNOSIS — N83201 Unspecified ovarian cyst, right side: Secondary | ICD-10-CM

## 2020-08-09 DIAGNOSIS — R198 Other specified symptoms and signs involving the digestive system and abdomen: Secondary | ICD-10-CM | POA: Diagnosis not present

## 2020-08-09 DIAGNOSIS — N854 Malposition of uterus: Secondary | ICD-10-CM

## 2020-08-09 NOTE — Progress Notes (Signed)
History: 34 year old G2P2 presents for ultrasound follow up. She complains of left lower back pain for 2 months that is worse when lying down. The pain is tolerable and is not worsening. No pain with intercourse. Regular monthly cycles. She was seen May 2021 with complaints of recurrent luteal pelvic discomfort.  Ultrasound 02/2020 showed normal right ovary and probable hemorrhagic cyst of left ovary measuring 4.8 x 2.6 cm, normal uterus and endometrium. History of umbilical hernia repair and lap appendectomy. Family history of ovarian cancer.   Exam: Appears well Ultrasound: Anteverted uterus, normal size and shape, no myometrial masses. Thickened symmetrical endometrium 10.6 mm - on day 21 of cycle, avascular, no masses seen.  Left ovary appears normal with normal follicle pattern.  Right ovary with thick-walled cyst 46 x 33 mm, internal echoes, all avascular, consistent with hemorrhagic cyst.  Right adnexa and midline is tubular cystic structure 44 x 15 mm, thin walls, echo-free, appearance of pelvic inclusion cyst, avascular.  Left adnexa appears negative.  Mild, clear free fluid seen at superior aspect of right ovary and cul-de-sac.  Assessment: Right ovarian cyst Chronic left-sided low back pain without sciatica Right pelvic adnexal fluid collection  Plan: Discussed ultrasound findings of right ovarian cyst consistent with hemorrhagic cyst and right adnexal pelvic inclusion cyst most likely from adhesions from previous appendectomy. Reassured that left cyst has resolved. We will repeat ultrasound in 8 weeks to evaluate cyst. Ibuprofen/Tylenol as needed for pain. Pain location not consistent with ultrasound findings. Discussed with her that this could be a musculoskeletal issue since it hurts in certain positions or endometriosis. All questions answered and she is agreeable to plan. Discussed ultrasound and plan of care with Dr. Seymour Bars.

## 2020-08-09 NOTE — Patient Instructions (Signed)
Ovarian Cyst     An ovarian cyst is a fluid-filled sac that forms on an ovary. The ovaries are small organs that produce eggs in women. Various types of cysts can form on the ovaries. Some may cause symptoms and require treatment. Most ovarian cysts go away on their own, are not cancerous (are benign), and do not cause problems. Common types of ovarian cysts include:  Functional (follicle) cysts. ? Occur during the menstrual cycle, and usually go away with the next menstrual cycle if you do not get pregnant. ? Usually cause no symptoms.  Endometriomas. ? Are cysts that form from the tissue that lines the uterus (endometrium). ? Are sometimes called "chocolate cysts" because they become filled with blood that turns brown. ? Can cause pain in the lower abdomen during intercourse and during your period.  Cystadenoma cysts. ? Develop from cells on the outside surface of the ovary. ? Can get very large and cause lower abdomen pain and pain with intercourse. ? Can cause severe pain if they twist or break open (rupture).  Dermoid cysts. ? Are sometimes found in both ovaries. ? May contain different kinds of body tissue, such as skin, teeth, hair, or cartilage. ? Usually do not cause symptoms unless they get very big.  Theca lutein cysts. ? Occur when too much of a certain hormone (human chorionic gonadotropin) is produced and overstimulates the ovaries to produce an egg. ? Are most common after having procedures used to assist with the conception of a baby (in vitro fertilization). What are the causes? Ovarian cysts may be caused by:  Ovarian hyperstimulation syndrome. This is a condition that can develop from taking fertility medicines. It causes multiple large ovarian cysts to form.  Polycystic ovarian syndrome (PCOS). This is a common hormonal disorder that can cause ovarian cysts, as well as problems with your period or fertility. What increases the risk? The following factors may  make you more likely to develop ovarian cysts:  Being overweight or obese.  Taking fertility medicines.  Taking certain forms of hormonal birth control.  Smoking. What are the signs or symptoms? Many ovarian cysts do not cause symptoms. If symptoms are present, they may include:  Pelvic pain or pressure.  Pain in the lower abdomen.  Pain during sex.  Abdominal swelling.  Abnormal menstrual periods.  Increasing pain with menstrual periods. How is this diagnosed? These cysts are commonly found during a routine pelvic exam. You may have tests to find out more about the cyst, such as:  Ultrasound.  X-ray of the pelvis.  CT scan.  MRI.  Blood tests. How is this treated? Many ovarian cysts go away on their own without treatment. Your health care provider may want to check your cyst regularly for 2-3 months to see if it changes. If you are in menopause, it is especially important to have your cyst monitored closely because menopausal women have a higher rate of ovarian cancer. When treatment is needed, it may include:  Medicines to help relieve pain.  A procedure to drain the cyst (aspiration).  Surgery to remove the whole cyst.  Hormone treatment or birth control pills. These methods are sometimes used to help dissolve a cyst. Follow these instructions at home:  Take over-the-counter and prescription medicines only as told by your health care provider.  Do not drive or use heavy machinery while taking prescription pain medicine.  Get regular pelvic exams and Pap tests as often as told by your health care provider.    Return to your normal activities as told by your health care provider. Ask your health care provider what activities are safe for you.  Do not use any products that contain nicotine or tobacco, such as cigarettes and e-cigarettes. If you need help quitting, ask your health care provider.  Keep all follow-up visits as told by your health care provider.  This is important. Contact a health care provider if:  Your periods are late, irregular, or painful, or they stop.  You have pelvic pain that does not go away.  You have pressure on your bladder or trouble emptying your bladder completely.  You have pain during sex.  You have any of the following in your abdomen: ? A feeling of fullness. ? Pressure. ? Discomfort. ? Pain that does not go away. ? Swelling.  You feel generally ill.  You become constipated.  You lose your appetite.  You develop severe acne.  You start to have more body hair and facial hair.  You are gaining weight or losing weight without changing your exercise and eating habits.  You think you may be pregnant. Get help right away if:  You have abdominal pain that is severe or gets worse.  You cannot eat or drink without vomiting.  You suddenly develop a fever.  Your menstrual period is much heavier than usual. This information is not intended to replace advice given to you by your health care provider. Make sure you discuss any questions you have with your health care provider. Document Revised: 01/06/2018 Document Reviewed: 03/11/2016 Elsevier Patient Education  2020 [employer]  

## 2020-10-07 DIAGNOSIS — Z8041 Family history of malignant neoplasm of ovary: Secondary | ICD-10-CM | POA: Diagnosis not present

## 2020-10-07 DIAGNOSIS — R1032 Left lower quadrant pain: Secondary | ICD-10-CM | POA: Diagnosis not present

## 2020-10-07 DIAGNOSIS — N83209 Unspecified ovarian cyst, unspecified side: Secondary | ICD-10-CM | POA: Diagnosis not present

## 2020-10-13 DIAGNOSIS — N83202 Unspecified ovarian cyst, left side: Secondary | ICD-10-CM | POA: Diagnosis not present

## 2020-10-13 DIAGNOSIS — D270 Benign neoplasm of right ovary: Secondary | ICD-10-CM | POA: Diagnosis not present

## 2020-10-13 DIAGNOSIS — R109 Unspecified abdominal pain: Secondary | ICD-10-CM | POA: Diagnosis not present

## 2020-11-11 ENCOUNTER — Ambulatory Visit (INDEPENDENT_AMBULATORY_CARE_PROVIDER_SITE_OTHER): Payer: BC Managed Care – PPO

## 2020-11-11 ENCOUNTER — Ambulatory Visit: Payer: BC Managed Care – PPO | Admitting: Adult Health

## 2020-11-11 ENCOUNTER — Other Ambulatory Visit: Payer: Self-pay

## 2020-11-11 ENCOUNTER — Encounter: Payer: Self-pay | Admitting: Adult Health

## 2020-11-11 VITALS — BP 110/74 | Temp 98.2°F | Wt 127.0 lb

## 2020-11-11 DIAGNOSIS — M545 Low back pain, unspecified: Secondary | ICD-10-CM | POA: Diagnosis not present

## 2020-11-11 DIAGNOSIS — R1032 Left lower quadrant pain: Secondary | ICD-10-CM

## 2020-11-11 DIAGNOSIS — R109 Unspecified abdominal pain: Secondary | ICD-10-CM | POA: Diagnosis not present

## 2020-11-11 NOTE — Progress Notes (Signed)
Subjective:    Patient ID: Charlotte Giles, female    DOB: 1985-12-24, 35 y.o.   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  He has been evaluated today for left lower abdominal pain that radiates to left lower back, pain has been daily for 6-8 months Pain is worse when she lays down or sitting. The pain is felt as a " knot or cramp".  She denies constipation, diarrhea, or worsening pain with palpation, fevers, chills, hematuria, dysuria, or blood in stool.   She was seen by her GYN in October 2021 at which time she did have an ultrasound which showed that her left ovary appeared normal with normal follicular pattern.  Right ovary with thick walled cyst 46 x 33 mm consistent with hemorrhagic cyst.  She has no pain when she exercises or ambulatory.  She has tried Tylenol/Motrin without relief in her symptoms.   Review of Systems See HPI   Past Medical History:  Diagnosis Date  . Acute blood loss anemia 12/12/2013  . Anemia   . 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 HSV infection     Social History   Socioeconomic History  . Marital status: Married    Spouse name: Not on file  . Number of children: Not on file  . Years of education: Not on file  . Highest education level: Not on file  Occupational History  . Occupation: nurse  Tobacco Use  . Smoking status: Never Smoker  . Smokeless tobacco: Never Used  Vaping Use  . Vaping Use: Never used  Substance and Sexual Activity  . Alcohol use: Yes    Alcohol/week: 0.0 standard drinks    Comment: social   . Drug use: No  . Sexual activity: Yes    Partners: Male    Birth control/protection: Condom    Comment: 1st intercourse- 18, partners- 2, married- 8 yrs   Other Topics Concern  . Not on file  Social History  Narrative   Nurse in Medical ICU - RN    Two children - 4 and 2    Married - 6 years       Social Determinants of Corporate investment banker Strain: Not on file  Food Insecurity: Not on file  Transportation Needs: Not on file  Physical Activity: Not on file  Stress: Not on file  Social Connections: Not on file  Intimate Partner Violence: Not on file    Past Surgical History:  Procedure Laterality Date  . HERNIA REPAIR    . LAPAROSCOPIC APPENDECTOMY N/A 02/25/2018   Procedure: APPENDECTOMY LAPAROSCOPIC;  Surgeon: Manus Rudd, MD;  Location: [facility] OR;  Service: General;  Laterality: N/A;    Family History  Problem Relation Age of Onset  . Hypertension Mother   . Ovarian cancer Mother   . Cancer Father 39       PROSTATE  . Diabetes Maternal Grandmother   . Hypertension Maternal Grandmother   . Sudden Cardiac Death Maternal Uncle 37       MI   . Breast cancer Neg Hx     No Known Allergies  Current Outpatient Medications on File Prior to Visit  Medication Sig Dispense Refill  . acetaminophen (TYLENOL) 500 MG tablet Take 500-1,000 mg by mouth every 8 (eight) hours as  needed (for headaches).     Marland Kitchen ibuprofen (ADVIL,MOTRIN) 200 MG tablet Take 600 mg by mouth every 6 (six) hours as needed (for headaches).     . Prenatal Vit-Fe Fumarate-FA (PRENATAL MULTIVITAMIN) TABS tablet Take 1 tablet by mouth daily at 12 noon.     No current facility-administered medications on file prior to visit.    BP 110/74   Temp 98.2 F (36.8 C)   Wt 127 lb (57.6 kg)   BMI 23.61 kg/m       Objective:   Physical Exam Vitals and nursing note reviewed.  Constitutional:      Appearance: Normal appearance.  Cardiovascular:     Rate and Rhythm: Normal rate and regular rhythm.     Pulses: Normal pulses.     Heart sounds: Normal heart sounds.  Pulmonary:     Effort: Pulmonary effort is normal.     Breath sounds: Normal breath sounds.  Abdominal:     General: Abdomen is flat. Bowel sounds  are normal. There is no distension.     Palpations: Abdomen is soft. There is no mass.     Tenderness: There is no abdominal tenderness. There is no right CVA tenderness, left CVA tenderness, guarding or rebound.     Hernia: No hernia is present.  Musculoskeletal:        General: Normal range of motion.  Skin:    General: Skin is warm and dry.     Capillary Refill: Capillary refill takes less than 2 seconds.  Neurological:     General: No focal deficit present.     Mental Status: She is alert and oriented to person, place, and time.  Psychiatric:        Mood and Affect: Mood normal.        Behavior: Behavior normal.        Thought Content: Thought content normal.        Judgment: Judgment normal.       Assessment & Plan:  1. LLQ pain -Unknown cause of pain this time as her exam was benign.  She was in discomfort when laying on the exam table as well as sitting in the chair.  Differentials include diverticulitis, kidney pain, chronic pelvic pain, inflammatory bowel disease, muscle strain.  Due to ongoing issues for the last 6 to 8 months we will get CT abdomen and pelvis, look at lumbar spine and KUB as well as lab work. - CT Abdomen Pelvis W Contrast; Future - DG Lumbar Spine Complete; Future - DG Abd 1 View; Future - CMP; Future - CBC with Differential/Platelet; Future - Urinalysis; Future - Celiac Pnl 2 rflx Endomysial Ab Ttr; Future - CBC with Differential/Platelet - CMP - Urinalysis - Celiac Pnl 2 rflx Endomysial Ab Ttr  Shirline Frees, NP

## 2020-11-14 LAB — COMPREHENSIVE METABOLIC PANEL
AG Ratio: 1.5 (calc) (ref 1.0–2.5)
ALT: 12 U/L (ref 6–29)
AST: 17 U/L (ref 10–30)
Albumin: 4.3 g/dL (ref 3.6–5.1)
Alkaline phosphatase (APISO): 40 U/L (ref 31–125)
BUN: 15 mg/dL (ref 7–25)
CO2: 23 mmol/L (ref 20–32)
Calcium: 9.6 mg/dL (ref 8.6–10.2)
Chloride: 104 mmol/L (ref 98–110)
Creat: 0.97 mg/dL (ref 0.50–1.10)
Globulin: 2.9 g/dL (calc) (ref 1.9–3.7)
Glucose, Bld: 94 mg/dL (ref 65–99)
Potassium: 3.9 mmol/L (ref 3.5–5.3)
Sodium: 140 mmol/L (ref 135–146)
Total Bilirubin: 0.7 mg/dL (ref 0.2–1.2)
Total Protein: 7.2 g/dL (ref 6.1–8.1)

## 2020-11-14 LAB — CBC WITH DIFFERENTIAL/PLATELET
Absolute Monocytes: 645 cells/uL (ref 200–950)
Basophils Absolute: 43 cells/uL (ref 0–200)
Basophils Relative: 0.5 %
Eosinophils Absolute: 52 cells/uL (ref 15–500)
Eosinophils Relative: 0.6 %
HCT: 36.6 % (ref 35.0–45.0)
Hemoglobin: 12 g/dL (ref 11.7–15.5)
Lymphs Abs: 3294 cells/uL (ref 850–3900)
MCH: 29.7 pg (ref 27.0–33.0)
MCHC: 32.8 g/dL (ref 32.0–36.0)
MCV: 90.6 fL (ref 80.0–100.0)
MPV: 12.2 fL (ref 7.5–12.5)
Monocytes Relative: 7.5 %
Neutro Abs: 4567 cells/uL (ref 1500–7800)
Neutrophils Relative %: 53.1 %
Platelets: 237 10*3/uL (ref 140–400)
RBC: 4.04 10*6/uL (ref 3.80–5.10)
RDW: 11.8 % (ref 11.0–15.0)
Total Lymphocyte: 38.3 %
WBC: 8.6 10*3/uL (ref 3.8–10.8)

## 2020-11-14 LAB — TIQ-MISC

## 2020-11-14 LAB — URINALYSIS, COMPLETE
Bilirubin Urine: NEGATIVE
Crystals: NONE SEEN /HPF
Glucose, UA: NEGATIVE
Hgb urine dipstick: NEGATIVE
Ketones, ur: NEGATIVE
Leukocytes,Ua: NEGATIVE
Nitrite: NEGATIVE
Protein, ur: NEGATIVE
Specific Gravity, Urine: 1.01 (ref 1.001–1.03)
pH: 5.5 (ref 5.0–8.0)

## 2020-11-15 ENCOUNTER — Ambulatory Visit: Payer: BC Managed Care – PPO | Admitting: Adult Health

## 2020-11-18 LAB — CELIAC PNL 2 RFLX ENDOMYSIAL AB TTR
(tTG) Ab, IgA: <1 U/mL
(tTG) Ab, IgG: <1 U/mL
Endomysial Ab IgA: NEGATIVE
Gliadin IgA: 3 U/mL
Gliadin IgG: 17.5 U/mL — ABNORMAL HIGH
Immunoglobulin A: 94 mg/dL (ref 47–310)

## 2020-11-28 ENCOUNTER — Ambulatory Visit
Admission: RE | Admit: 2020-11-28 | Discharge: 2020-11-28 | Disposition: A | Discharge status: Home / Self Care | Payer: BC Managed Care – PPO | Source: Ambulatory Visit | Attending: Adult Health | Admitting: Adult Health

## 2020-11-28 DIAGNOSIS — K3189 Other diseases of stomach and duodenum: Secondary | ICD-10-CM | POA: Diagnosis not present

## 2020-11-28 DIAGNOSIS — R1032 Left lower quadrant pain: Secondary | ICD-10-CM

## 2020-11-28 MED ORDER — IOPAMIDOL (ISOVUE-300) INJECTION 61%
100.0000 mL | Freq: Once | INTRAVENOUS | Status: AC | PRN
  Administered 2020-11-28: 100 mL via INTRAVENOUS

## 2020-11-29 ENCOUNTER — Telehealth: Payer: Self-pay | Admitting: Adult Health

## 2020-11-29 MED ORDER — CYCLOBENZAPRINE HCL 10 MG PO TABS: 10.0000 mg | ORAL_TABLET | Freq: Three times a day (TID) | ORAL | 0 refills | Status: DC | PRN

## 2020-11-29 NOTE — Telephone Encounter (Signed)
Dated patient on recent results from her CT abdomen and pelvis.  No cause of left lower quadrant pain has been established.  At this point I am wondering if it is more muscular.  She is okay with trying a short course of muscle relaxers to see if this helps with her pain.  She will follow-up with me via MyChart and let me know how she does

## 2020-12-05 ENCOUNTER — Encounter: Payer: Self-pay | Admitting: Adult Health

## 2020-12-07 ENCOUNTER — Other Ambulatory Visit: Payer: Self-pay | Admitting: Adult Health

## 2020-12-07 DIAGNOSIS — R1032 Left lower quadrant pain: Secondary | ICD-10-CM

## 2020-12-08 DIAGNOSIS — N83201 Unspecified ovarian cyst, right side: Secondary | ICD-10-CM | POA: Diagnosis not present

## 2020-12-16 ENCOUNTER — Encounter: Payer: Self-pay | Admitting: Physical Therapy

## 2020-12-16 ENCOUNTER — Ambulatory Visit: Payer: BC Managed Care – PPO | Admitting: Physical Therapy

## 2020-12-16 ENCOUNTER — Other Ambulatory Visit: Payer: Self-pay

## 2020-12-16 DIAGNOSIS — M545 Low back pain, unspecified: Secondary | ICD-10-CM | POA: Diagnosis not present

## 2020-12-16 DIAGNOSIS — M6281 Muscle weakness (generalized): Secondary | ICD-10-CM | POA: Diagnosis not present

## 2020-12-16 NOTE — Patient Instructions (Signed)
Access Code: [email or web address] URL: [email or web address] Date: 12/16/2020 Prepared by: Zebedee Iba  Exercises Supine Posterior Pelvic Tilt - 2 x daily - 7 x weekly - 2 sets - 10 reps - 2 hold Child's Pose with Sidebending - 2 x daily - 7 x weekly - 1 sets - 10 reps - 10 hold Seated Quadratus Lumborum Stretch with Arm Overhead - 2 x daily - 7 x weekly - 1 sets - 3 reps - 30 hold

## 2020-12-16 NOTE — Therapy (Signed)
[facility] [address] [place], [place], [address] Phone: [phone]   Fax:  [phone]  Physical Therapy Evaluation  Patient Details  Name: Charlotte Giles MRN: [id number] Date of Birth: 10/20/1986 Referring Provider (PT): Shirline Freesory Nafziger, NP   Encounter Date: 12/16/2020   PT End of Session - 12/16/20 0958    Visit Number 1    Number of Visits 7    Date for PT Re-Evaluation 01/15/21    Authorization Type BCBS    PT Start Time 0803    PT Stop Time 0847    PT Time Calculation (min) 44 min    Activity Tolerance Patient tolerated treatment well;No increased pain    Behavior During Therapy WFL for tasks assessed/performed           Past Medical History:  Diagnosis Date  . Acute blood loss anemia 12/12/2013  . Anemia   . 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 HSV infection     Past Surgical History:  Procedure Laterality Date  . HERNIA REPAIR    . LAPAROSCOPIC APPENDECTOMY N/A 02/25/2018   Procedure: APPENDECTOMY LAPAROSCOPIC;  Surgeon: Manus Ruddsuei, Matthew, MD;  Location: [facility] OR;  Service: General;  Laterality: N/A;    There were no vitals filed for this visit.    Subjective Assessment - 12/16/20 0806    Subjective Pt states the pain has been going on 8-9 months at this point. Pt thought it was related to appendix and hernia repair initially but it has become constant. She states imaging did not show anything. She states she feels the pain when she sits or lays down. There are no other aggravating factors she can think of. The pain is related to position and is does not present as unrelenting. She states the pain is into the back and also front L of the lower abdomen. She states she works from home. Pt states works out at home through a home smart phone based training program. Pt denies traumatic incident or injury. Pt states the flexril does help sleep at night. Pt reports she sitting or laying  5-10 mins it will causes it to start hurting. She feels that something is "crunched in there" in the front of the abdomen. Pt states there is NT into the leg when she sleeps. 5/10 worst 0/10 at best, 2/10 sitting at rest. Pt denies cancer red flag, kidney, and GI related questions.    Limitations Sitting;Lifting    How long can you sit comfortably? 5-10 mins    Patient Stated Goals Return to normal day to day function without pain    Currently in Pain? Yes    Pain Score 1     Pain Location Abdomen    Pain Orientation Lower;Left    Pain Descriptors / Indicators Dull;Constant    Pain Type Chronic pain    Pain Radiating Towards n/a    Pain Onset More than a month ago    Pain Frequency Intermittent    Aggravating Factors  sitting and laying    Pain Relieving Factors movement    Multiple Pain Sites Yes    Pain Score 1    Pain Location Back    Pain Orientation Left;Lower    Pain Descriptors / Indicators Constant;Dull    Pain Type Chronic pain    Pain Onset More than a month ago    Pain Frequency Intermittent  [facility] PT Assessment - 12/16/20 0001      Assessment   Medical Diagnosis Low back pain    Referring Provider (PT) Shirline Frees, NP    Prior Therapy N/A      Precautions   Precautions None      Restrictions   Weight Bearing Restrictions No      Balance Screen   Has the patient fallen in the past 6 months No    Has the patient had a decrease in activity level because of a fear of falling?  No    Is the patient reluctant to leave their home because of a fear of falling?  No      Home Tourist information centre manager residence      Prior Function   Level of Independence Independent      Cognition   Overall Cognitive Status Within Functional Limits for tasks assessed      Observation/Other Assessments   Other Surveys  Oswestry Disability Index    Oswestry Disability Index  11% disability      Functional Tests   Functional tests Squat;Step  down;Step up      Squat   Comments increased fwd trunk lean, decreased ankle DF, above parallel depth, quad dominant      Step Up   Comments Bilat Trendelenberg      Step Down   Comments bilat Trendelenberg and dynamic valgus      Posture/Postural Control   Posture/Postural Control Postural limitations    Postural Limitations Decreased lumbar lordosis      ROM / Strength   AROM / PROM / Strength AROM;Strength      AROM   Overall AROM  Within functional limits for tasks performed    Overall AROM Comments WNL- pain with L SB and extension and L lean    AROM Assessment Site Lumbar      Strength   Overall Strength Deficits    Overall Strength Comments 4/5 bilaterally in hips, no myotomal weakness at great toe or ankle EV      Palpation   Spinal mobility decreased tolerance to PA glide at L2-3 "nerve pain"    Palpation comment hypertonicity of L L/S paraspinals and QL      Special Tests    Special Tests Lumbar;Sacrolliac Tests    Lumbar Tests FABER test;Straight Leg Raise    Sacroiliac Tests  Sacral Thrust      FABER test   findings Negative      Straight Leg Raise   Findings Negative      Sacral thrust    Findings Negative      Sacral Compression   Findings Negative      Ambulation/Gait   Gait Pattern Within Functional Limits                      Objective measurements completed on examination: See above findings.       [facility] Adult PT Treatment/Exercise - 12/16/20 0001      Exercises   Exercises Lumbar;Knee/Hip      Lumbar Exercises: Stretches   Pelvic Tilt 20 reps    Pelvic Tilt Limitations 2s hold    Other Lumbar Stretch Exercise prayer stretch 30s 3x, standing QL stretch 30s 3x      Manual Therapy   Manual Therapy Joint mobilization;Soft tissue mobilization    Joint Mobilization grade II prone PA L1-5, pain at L2-3    Soft tissue mobilization L lumbar para and  QL                  PT Education - 12/16/20 0958    Education  Details MOI, diagnosis, prognosis, anatomy, daily movement, HEP, POC    Person(s) Educated Patient    Methods Explanation;Demonstration;Handout    Comprehension Verbalized understanding;Returned demonstration            PT Short Term Goals - 12/16/20 1014      PT SHORT TERM GOAL #1   Title Pt will be independent with HEP in order to demonstrate synthesis of PT education.    Time 2    Period Weeks    Status New      PT SHORT TERM GOAL #2   Title Pt will report ability to sit for >1 hour in order to demonstrate functional improvement in ability to tolerate static positioning.    Time 3    Period Weeks    Status New             PT Long Term Goals - 12/16/20 1012      PT LONG TERM GOAL #1   Title Pt will be able to perform step down test without dynamic valgus or Trendelenberg in order to demonstrate functional improvement in lumbopelvic control.    Time 6    Period Weeks    Status New      PT LONG TERM GOAL #2   Title Pt will report ability to sleep >8 hours without NT or pain into L/S in order to demonstrate functional improvement with back pain and quality of sleep.    Time 6    Period Weeks    Status New                  Plan - 12/16/20 0959    Clinical Impression Statement Pt is a 35 y.o. female presenting to PT eval for CC of LBP and LLQ pain. Pt presents with decreased lumbopelvic motor control, decreased L LE strength, and muscle spasm and guarding of the L L/S. Pt s/s are consistent with a potential lumbar radiculopathy as recreation of sx are mechanical in nature. Pt has full AROM but has increased pain with L SB/ closing movements. Pt found relief of symptoms after STM and self stretching exercise. Pt's deficits are general core and LE strength as well as muscle hypertonicity likely causes radiating pain into LE and abdominal area. Testing does not suggest psoas abscess, AAA, or GI causes. Pt impairments currently limit her in full work duties, sleep, and  physical exercise/fitness. Pt would benefit from continued skilled therapy in order to demonstrate functional improvement in lumbopelvic motor control and strength for full return to PLOF.    Personal Factors and Comorbidities Profession;Time since onset of injury/illness/exacerbation    Examination-Activity Limitations Sleep;Sit    Examination-Participation Restrictions Occupation    Stability/Clinical Decision Making Stable/Uncomplicated    Clinical Decision Making Low    Rehab Potential Excellent    PT Frequency 1x / week    PT Duration 6 weeks    PT Treatment/Interventions ADLs/Self Care Home Management;Biofeedback;Electrical Stimulation;Iontophoresis 4mg /ml Dexamethasone;Moist Heat;Traction;Ultrasound;Gait training;Stair training;Functional mobility training;Therapeutic activities;Therapeutic exercise;Balance training;Neuromuscular re-education;Patient/family education;Manual techniques;Scar mobilization;Passive range of motion;Dry needling;Taping;Spinal Manipulations;Joint Manipulations    PT Next Visit Plan review HEP, trial kneeling hip flexor stretch, paloff press, bridge with march, possible kidney percussion test*    PT Home Exercise Plan printout provided    Consulted and Agree with Plan of Care Patient  Patient will benefit from skilled therapeutic intervention in order to improve the following deficits and impairments:  Decreased mobility,Increased muscle spasms,Hypomobility,Pain,Decreased strength  Visit Diagnosis: Lumbar pain  Muscle weakness (generalized)     Problem List Patient Active Problem List   Diagnosis Date Noted  . Acute appendicitis 02/25/2018  . H/O hernia repair 02/25/2018  . Family history of ovarian cancer 01/15/2017    Zebedee Iba PT, DPT 12/16/20 12:15 PM   [facility] [address] [place], [place], [address] Phone: [phone]   Fax:  [phone]  Name: Charlotte Giles MRN:  [id number] Date of Birth: [birth year]

## 2020-12-23 ENCOUNTER — Encounter: Payer: Self-pay | Admitting: Physical Therapy

## 2020-12-23 ENCOUNTER — Ambulatory Visit: Payer: BC Managed Care – PPO | Admitting: Physical Therapy

## 2020-12-23 ENCOUNTER — Other Ambulatory Visit: Payer: Self-pay

## 2020-12-23 DIAGNOSIS — M6281 Muscle weakness (generalized): Secondary | ICD-10-CM

## 2020-12-23 DIAGNOSIS — M545 Low back pain, unspecified: Secondary | ICD-10-CM

## 2020-12-23 NOTE — Therapy (Signed)
[facility] [address] [place], [place], [address] Phone: [phone]   Fax:  [phone]  Physical Therapy Treatment  Patient Details  Name: Charlotte Giles MRN: [id number] Date of Birth: 11/29/1985 Referring Provider (PT): Shirline Frees, NP   Encounter Date: 12/23/2020   PT End of Session - 12/23/20 0904    Visit Number 2    Number of Visits 7    Date for PT Re-Evaluation 01/15/21    Authorization Type BCBS    PT Start Time 0802    PT Stop Time 0840    PT Time Calculation (min) 38 min    Activity Tolerance Patient tolerated treatment well;No increased pain    Behavior During Therapy WFL for tasks assessed/performed           Past Medical History:  Diagnosis Date  . Acute blood loss anemia 12/12/2013  . Anemia   . 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 HSV infection     Past Surgical History:  Procedure Laterality Date  . HERNIA REPAIR    . LAPAROSCOPIC APPENDECTOMY N/A 02/25/2018   Procedure: APPENDECTOMY LAPAROSCOPIC;  Surgeon: Manus Rudd, MD;  Location: [facility] OR;  Service: General;  Laterality: N/A;    There were no vitals filed for this visit.   Subjective Assessment - 12/23/20 0852    Subjective Pt states that the intensity of the pain has gone down though it is still similar. She states it still happens when she is sitting for a while but it does not happen as frequently. Pt reports no issues with HEP.    Limitations Sitting;Lifting    How long can you sit comfortably? 5-10 mins    Patient Stated Goals Return to normal day to day function without pain    Currently in Pain? Yes    Pain Score 2     Pain Location Abdomen    Pain Orientation Left;Lower    Pain Descriptors / Indicators Dull    Pain Onset More than a month ago    Multiple Pain Sites No    Pain Onset More than a month ago                             [facility] Adult PT Treatment/Exercise -  12/23/20 0001      Ambulation/Gait   Gait Pattern Within Functional Limits      Posture/Postural Control   Posture/Postural Control Postural limitations    Postural Limitations Decreased lumbar lordosis      Exercises   Exercises Lumbar;Knee/Hip      Lumbar Exercises: Stretches   Pelvic Tilt 20 reps    Pelvic Tilt Limitations 2s hold    Quad Stretch 3 reps;30 seconds    Quad Stretch Limitations prone    Other Lumbar Stretch Exercise prayer stretch 30s 3x, standing QL stretch 30s 3x    Other Lumbar Stretch Exercise kneeling hip flexor stretch 30s 3x      Lumbar Exercises: Standing   Other Standing Lumbar Exercises Paloff press 15x GTB      Knee/Hip Exercises: Supine   Bridges 20 reps    Bridges Limitations marching      Manual Therapy   Manual Therapy Joint mobilization;Soft tissue mobilization    Joint Mobilization grade II prone PA L1-5, pain at L2-3; S/L L hip extension grade III    Soft tissue mobilization L lumbar para and QL  PT Education - 12/23/20 0903    Education Details anatomy, daily movement variation, HEP importance    Person(s) Educated Patient    Methods Explanation;Demonstration;Handout    Comprehension Verbalized understanding;Returned demonstration            PT Short Term Goals - 12/16/20 1014      PT SHORT TERM GOAL #1   Title Pt will be independent with HEP in order to demonstrate synthesis of PT education.    Time 2    Period Weeks    Status New      PT SHORT TERM GOAL #2   Title Pt will report ability to sit for >1 hour in order to demonstrate functional improvement in ability to tolerate static positioning.    Time 3    Period Weeks    Status New             PT Long Term Goals - 12/16/20 1012      PT LONG TERM GOAL #1   Title Pt will be able to perform step down test without dynamic valgus or Trendelenberg in order to demonstrate functional improvement in lumbopelvic control.    Time 6    Period Weeks     Status New      PT LONG TERM GOAL #2   Title Pt will report ability to sleep >8 hours without NT or pain into L/S in order to demonstrate functional improvement with back pain and quality of sleep.    Time 6    Period Weeks    Status New                 Plan - 12/23/20 0902    Clinical Impression Statement Pt presents with L sided hip extension deficits that appear to be soft tissue in nature due to springy end feel. Pt had moderate improvement with R SB ROM after manual therapy and exercise. Pt found increased stretching sensation and weakness on L side with hip flexor stretch and anti rotation press. May trial active hip flexor release at next session. Pt would benefit from continued skilled therapy in order maximize functional core strength, meet goals, reduce pain, and return to PLOF.    Personal Factors and Comorbidities Profession;Time since onset of injury/illness/exacerbation    Examination-Activity Limitations Sleep;Sit    Examination-Participation Restrictions Occupation    Stability/Clinical Decision Making Stable/Uncomplicated    Rehab Potential Excellent    PT Frequency 1x / week    PT Duration 6 weeks    PT Treatment/Interventions ADLs/Self Care Home Management;Biofeedback;Electrical Stimulation;Iontophoresis 4mg /ml Dexamethasone;Moist Heat;Traction;Ultrasound;Gait training;Stair training;Functional mobility training;Therapeutic activities;Therapeutic exercise;Balance training;Neuromuscular re-education;Patient/family education;Manual techniques;Scar mobilization;Passive range of motion;Dry needling;Taping;Spinal Manipulations;Joint Manipulations    PT Next Visit Plan review HEP, S/L L rotation mob into possible gapping manip? 90/90 hip flexor band, paloff press, modified thomas    PT Home Exercise Plan printout provided    Consulted and Agree with Plan of Care Patient           Patient will benefit from skilled therapeutic intervention in order to improve the  following deficits and impairments:  Decreased mobility,Increased muscle spasms,Hypomobility,Pain,Decreased strength  Visit Diagnosis: Lumbar pain  Muscle weakness (generalized)     Problem List Patient Active Problem List   Diagnosis Date Noted  . Acute appendicitis 02/25/2018  . H/O hernia repair 02/25/2018  . Family history of ovarian cancer 01/15/2017    01/17/2017 PT, DPT 12/23/20 9:08 AM    [facility] [phone]  [address] [place], [place], [address] Phone: [phone]   Fax:  [phone]  Name: Ravonda Brecheen MRN: [id number] Date of Birth: 02-11-1986

## 2020-12-23 NOTE — Patient Instructions (Signed)
Access Code: [email or web address] URL: [email or web address] Date: 12/23/2020 Prepared by: Zebedee Iba  Exercises Half Kneeling Hip Flexor Stretch - 2 x daily - 7 x weekly - 1 sets - 3 reps - 30 hold Marching Bridge - 1 x daily - 7 x weekly - 3 sets - 10 reps Prone Quadriceps Stretch with Strap - 2 x daily - 7 x weekly - 1 sets - 3 reps - 30 hold

## 2020-12-26 ENCOUNTER — Encounter: Payer: Self-pay | Admitting: Physical Therapy

## 2020-12-26 ENCOUNTER — Other Ambulatory Visit: Payer: Self-pay

## 2020-12-26 ENCOUNTER — Ambulatory Visit: Payer: BC Managed Care – PPO | Admitting: Physical Therapy

## 2020-12-26 DIAGNOSIS — M545 Low back pain, unspecified: Secondary | ICD-10-CM | POA: Diagnosis not present

## 2020-12-26 DIAGNOSIS — M6281 Muscle weakness (generalized): Secondary | ICD-10-CM | POA: Diagnosis not present

## 2020-12-26 NOTE — Therapy (Addendum)
[facility] [address] [place], [place], [address] Phone: [phone]   Fax:  [phone]  Physical Therapy Treatment/Discharge  Patient Details  Name: Charlotte Giles MRN: [id number] Date of Birth: 14-Dec-1985 Referring Provider (PT): Shirline Frees, NP   Encounter Date: 12/26/2020   PT End of Session - 12/26/20 1634     Visit Number 3    Number of Visits 7    Date for PT Re-Evaluation 01/15/21    Authorization Type BCBS    PT Start Time 1605    PT Stop Time 1625    PT Time Calculation (min) 20 min    Activity Tolerance Patient tolerated treatment well;No increased pain    Behavior During Therapy [facility] for tasks assessed/performed             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   Subjective Assessment - 12/26/20 1607     Subjective Pt states that the intensity of the pain has gone down though it is still similar into the LLQ and abdomen. She states it still happens when she is sitting for a while but it does not happen as frequently. Pt states the drive for her vacation was uncomfortable at times and she needed more  Pt reports no issues with HEP.    Limitations Sitting;Lifting    How long can you sit comfortably? 5-10 mins    Patient Stated Goals Return to normal day to day function without pain    Currently in Pain? Yes    Pain Score 3     Pain Location Abdomen    Pain Orientation Left;Lower    Pain Descriptors / Indicators Dull    Pain Type Chronic pain    Pain Onset More than a month ago     Pain Frequency Intermittent    Multiple Pain Sites No    Pain Onset More than a month ago                                       PT Education - 12/26/20 1632     Education Details PT scope, anatomical nature of pain, POC, referral back to MD    Person(s) Educated Patient    Methods Explanation;Demonstration    Comprehension Verbalized understanding;Returned demonstration              PT Short Term Goals - 12/16/20 1014       PT SHORT TERM GOAL #1   Title Pt will be independent with HEP in order to demonstrate synthesis of PT education.    Time 2    Period Weeks    Status New      PT SHORT TERM GOAL #2   Title Pt will report ability to sit for >1 hour in order to demonstrate functional improvement in ability to tolerate static positioning.    Time 3    Period Weeks    Status New  PT Long Term Goals - 12/16/20 1012       PT LONG TERM GOAL #1   Title Pt will be able to perform step down test without dynamic valgus or Trendelenberg in order to demonstrate functional improvement in lumbopelvic control.    Time 6    Period Weeks    Status New      PT LONG TERM GOAL #2   Title Pt will report ability to sleep >8 hours without NT or pain into L/S in order to demonstrate functional improvement with back pain and quality of sleep.    Time 6    Period Weeks    Status New                   Plan - 12/26/20 1635     Clinical Impression Statement Pt's abdominal pain presentation at today's session was not mechanical in nature with lumbar mobilization or direction soft tissue mobilization/palpation. At initial eval, s/s responded to mechanical changes but progress/presentation does not follow expected trajectory. During L/S UPA at L1-3, pt reported "heartbeat" feeling within abdomen and with attempted palpation of L iliopsoas, pt experienced a sharp contralateral, R sided pain within the abdomen. AA pulse was also palpable  with mild pressure. Pt was advised to return to PCP soon in order to get further assessment of potential abdominal aorta related issue. Skilled therapy to be put on hold as pt explores need for futher medical management.    Personal Factors and Comorbidities Profession;Time since onset of injury/illness/exacerbation    Examination-Activity Limitations Sleep;Sit    Examination-Participation Restrictions Occupation    Stability/Clinical Decision Making Stable/Uncomplicated    Rehab Potential Excellent    PT Frequency 1x / week    PT Duration 6 weeks    PT Treatment/Interventions ADLs/Self Care Home Management;Biofeedback;Electrical Stimulation;Iontophoresis 4mg /ml Dexamethasone;Moist Heat;Traction;Ultrasound;Gait training;Stair training;Functional mobility training;Therapeutic activities;Therapeutic exercise;Balance training;Neuromuscular re-education;Patient/family education;Manual techniques;Scar mobilization;Passive range of motion;Dry needling;Taping;Spinal Manipulations;Joint Manipulations    PT Next Visit Plan review HEP, S/L L rotation mob into possible gapping manip? 90/90 hip flexor band, paloff press, modified thomas    Recommended Other Services Referral back to PCP    Consulted and Agree with Plan of Care Patient             Patient will benefit from skilled therapeutic intervention in order to improve the following deficits and impairments:  Decreased mobility,Increased muscle spasms,Hypomobility,Pain,Decreased strength  Visit Diagnosis: Lumbar pain  Muscle weakness (generalized)     Problem List Patient Active Problem List   Diagnosis Date Noted   Acute appendicitis 02/25/2018   H/O hernia repair 02/25/2018   Family history of ovarian cancer 01/15/2017   01/17/2017 PT, DPT 12/26/20 4:54 PM   [facility] [address] [place], [place], [place] Phone: [phone]   Fax:  [phone]  Name: Charlotte Giles MRN:  [name] Date of Birth: 1986-08-23   PHYSICAL THERAPY DISCHARGE SUMMARY  Visits from Start of Care: 3  Plan: Patient agrees to discharge.  Patient goals were not able to be assessed. Patient is being discharged due to not returning to PT.

## 2020-12-29 ENCOUNTER — Other Ambulatory Visit: Payer: Self-pay

## 2020-12-30 ENCOUNTER — Ambulatory Visit: Payer: BC Managed Care – PPO | Admitting: Adult Health

## 2020-12-30 ENCOUNTER — Encounter: Payer: Self-pay | Admitting: Adult Health

## 2020-12-30 VITALS — BP 118/70 | HR 72 | Ht 61.5 in | Wt 124.1 lb

## 2020-12-30 DIAGNOSIS — R1032 Left lower quadrant pain: Secondary | ICD-10-CM

## 2020-12-30 DIAGNOSIS — M545 Low back pain, unspecified: Secondary | ICD-10-CM | POA: Diagnosis not present

## 2020-12-30 NOTE — Progress Notes (Signed)
Subjective:    Patient ID: Charlotte Giles, female    DOB: October 05, 1986, 35 y.o.   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  She presents to the office today for follow-up regarding left-sided abdominal pain with radiating pain to left lower back. Has been going on for 8 months.  Pain is worse after laying down or sitting for 5-10 minutes She was seen by GYN in October 2021 at which time she did have an ultrasound that showed that her left ovary appeared normal with normal follicular pattern.  Right ovary with thick walled cyst consistent with hemorrhagic cyst.  She had a CT abdomen done on 11/29/2019  IMPRESSION: 1. No CT findings to explain the patient's left lower quadrant pain. 2. Moderately distended stomach.  It was thought that possibly her pain was more muscular from paraspinal/oblique muscles.  She was sent to physical therapy.  She does report after her first physical therapy appointment that she thought possibly that the pain had improved  During her third appointment it was noted that progression does not follow expected trajectory.  During L/S UPA at L1-L3 patient reported a "heartbeat" feeling within her abdomen and with attempted palpation of the left iliopsoas she experienced a sharp contralateral right sided pain within the abdomen.  Physical therapy noted that the AA pulse was also palpable with mild pressure.  He was advised to follow-up with her PCP for further evaluation   Review of Systems See HPI   Past Medical History:  Diagnosis Date  . Acute blood loss anemia 12/12/2013  . Anemia   . 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 HSV infection     Social History   Socioeconomic History  . Marital status: Married    Spouse name: Not on file  . Number of  children: Not on file  . Years of education: Not on file  . Highest education level: Not on file  Occupational History  . Occupation: nurse  Tobacco Use  . Smoking status: Never Smoker  . Smokeless tobacco: Never Used  Vaping Use  . Vaping Use: Never used  Substance and Sexual Activity  . Alcohol use: Yes    Alcohol/week: 0.0 standard drinks    Comment: social   . Drug use: No  . Sexual activity: Yes    Partners: Male    Birth control/protection: Condom    Comment: 1st intercourse- 18, partners- 2, married- 8 yrs   Other Topics Concern  . Not on file  Social History Narrative   Nurse in Medical ICU - RN    Two children - 4 and 2    Married - 6 years       Social Determinants of Corporate investment banker Strain: Not on file  Food Insecurity: Not on file  Transportation Needs: Not on file  Physical Activity: Not on file  Stress: Not on file  Social Connections: Not on file  Intimate Partner Violence: Not on file    Past Surgical History:  Procedure Laterality Date  . HERNIA REPAIR    . LAPAROSCOPIC APPENDECTOMY N/A 02/25/2018   Procedure: APPENDECTOMY LAPAROSCOPIC;  Surgeon: Manus Rudd, MD;  Location: [facility] OR;  Service: General;  Laterality: N/A;    Family History  Problem Relation Age of Onset  . Hypertension Mother   .  Ovarian cancer Mother   . Cancer Father 74       PROSTATE  . Diabetes Maternal Grandmother   . Hypertension Maternal Grandmother   . Sudden Cardiac Death Maternal Uncle 37       MI   . Breast cancer Neg Hx     No Known Allergies  Current Outpatient Medications on File Prior to Visit  Medication Sig Dispense Refill  . acetaminophen (TYLENOL) 500 MG tablet Take 500-1,000 mg by mouth every 8 (eight) hours as needed (for headaches).     . cyclobenzaprine (FLEXERIL) 10 MG tablet Take 1 tablet (10 mg total) by mouth 3 (three) times daily as needed for muscle spasms. 30 tablet 0  . ibuprofen (ADVIL,MOTRIN) 200 MG tablet Take 600 mg by mouth  every 6 (six) hours as needed (for headaches).     . Prenatal Vit-Fe Fumarate-FA (PRENATAL MULTIVITAMIN) TABS tablet Take 1 tablet by mouth daily at 12 noon.     No current facility-administered medications on file prior to visit.    BP 118/70   Pulse 72   Ht 5' 1.5" (1.562 m)   Wt 124 lb 2 oz (56.3 kg)   SpO2 99%   BMI 23.07 kg/m       Objective:   Physical Exam Vitals and nursing note reviewed.  Constitutional:      Appearance: Normal appearance. She is well-developed.  HENT:     Nose: Nose normal.     Mouth/Throat:     Mouth: Mucous membranes are dry.  Cardiovascular:     Rate and Rhythm: Normal rate and regular rhythm.     Pulses: Normal pulses.     Heart sounds: Normal heart sounds.  Pulmonary:     Effort: Pulmonary effort is normal.     Breath sounds: Normal breath sounds.  Abdominal:     General: Abdomen is flat. Bowel sounds are normal. There is no distension or abdominal bruit.     Palpations: Abdomen is soft. There is no mass.     Tenderness: There is abdominal tenderness in the right upper quadrant, periumbilical area, left upper quadrant and left lower quadrant. There is no right CVA tenderness, left CVA tenderness, guarding or rebound.     Hernia: No hernia is present.     Comments: Aortic pulse felt due to body habitus. No abdominal bruit     Musculoskeletal:        General: Normal range of motion.  Skin:    General: Skin is warm and dry.     Capillary Refill: Capillary refill takes less than 2 seconds.  Neurological:     General: No focal deficit present.     Mental Status: She is alert and oriented to person, place, and time.  Psychiatric:        Mood and Affect: Mood normal.        Behavior: Behavior normal.        Thought Content: Thought content normal.        Judgment: Judgment normal.       Assessment & Plan:  -I believe that her symptoms continue to be muscular.  Not worried about abdominal aneurysm.  I believe her pain is coming from  prior hernia repair.  Advised sports medicine consult, she would like to hold off on this currently.  Okay with her returning to physical therapy.  Shirline Frees, NP

## 2021-01-06 ENCOUNTER — Encounter: Payer: BC Managed Care – PPO | Admitting: Physical Therapy

## 2021-02-09 ENCOUNTER — Encounter: Payer: Self-pay | Admitting: Physical Therapy

## 2021-06-20 ENCOUNTER — Telehealth: Payer: Self-pay | Admitting: Adult Health

## 2021-06-21 NOTE — Telephone Encounter (Signed)
error 

## 2021-06-30 DIAGNOSIS — N912 Amenorrhea, unspecified: Secondary | ICD-10-CM | POA: Diagnosis not present

## 2021-07-11 DIAGNOSIS — Z3200 Encounter for pregnancy test, result unknown: Secondary | ICD-10-CM | POA: Diagnosis not present

## 2021-07-25 DIAGNOSIS — Z3491 Encounter for supervision of normal pregnancy, unspecified, first trimester: Secondary | ICD-10-CM | POA: Diagnosis not present

## 2021-08-03 ENCOUNTER — Ambulatory Visit: Payer: BC Managed Care – PPO | Admitting: Obstetrics & Gynecology

## 2021-08-14 DIAGNOSIS — O09521 Supervision of elderly multigravida, first trimester: Secondary | ICD-10-CM | POA: Diagnosis not present

## 2021-08-17 DIAGNOSIS — O219 Vomiting of pregnancy, unspecified: Secondary | ICD-10-CM | POA: Diagnosis not present

## 2021-08-17 DIAGNOSIS — Z3481 Encounter for supervision of other normal pregnancy, first trimester: Secondary | ICD-10-CM | POA: Diagnosis not present

## 2021-08-17 DIAGNOSIS — O09521 Supervision of elderly multigravida, first trimester: Secondary | ICD-10-CM | POA: Diagnosis not present

## 2021-08-17 DIAGNOSIS — O468X1 Other antepartum hemorrhage, first trimester: Secondary | ICD-10-CM | POA: Diagnosis not present

## 2021-09-18 DIAGNOSIS — Z3482 Encounter for supervision of other normal pregnancy, second trimester: Secondary | ICD-10-CM | POA: Diagnosis not present

## 2021-09-18 DIAGNOSIS — O09522 Supervision of elderly multigravida, second trimester: Secondary | ICD-10-CM | POA: Diagnosis not present

## 2021-10-18 DIAGNOSIS — Z3482 Encounter for supervision of other normal pregnancy, second trimester: Secondary | ICD-10-CM | POA: Diagnosis not present

## 2021-10-18 DIAGNOSIS — Z3689 Encounter for other specified antenatal screening: Secondary | ICD-10-CM | POA: Diagnosis not present

## 2021-10-18 DIAGNOSIS — O09522 Supervision of elderly multigravida, second trimester: Secondary | ICD-10-CM | POA: Diagnosis not present

## 2021-10-22 NOTE — L&D Delivery Note (Signed)
Delivery Note ?At 2:24 AM a viable and healthy female was delivered via Vaginal, Spontaneous (Presentation: Left Occiput Anterior).  APGAR: 9, 9; weight  pending.   ?Placenta status: Spontaneous, Intact.not sent  Cord: CAN x1 reducible 3 vessels with the following complications: None.  Cord pH: n/a ? ?Anesthesia: Epidural ?Episiotomy: None ?Lacerations: None ?Suture Repair:  n/a ?Est. Blood Loss (mL): 100 ? ?Mom to postpartum.  Baby to Couplet care / Skin to Skin. ? ?Charlotte Giles ?02/25/2022, 3:00 AM ? ? ? ?

## 2021-11-15 DIAGNOSIS — O09522 Supervision of elderly multigravida, second trimester: Secondary | ICD-10-CM | POA: Diagnosis not present

## 2021-11-15 DIAGNOSIS — Z3482 Encounter for supervision of other normal pregnancy, second trimester: Secondary | ICD-10-CM | POA: Diagnosis not present

## 2021-12-12 DIAGNOSIS — O09523 Supervision of elderly multigravida, third trimester: Secondary | ICD-10-CM | POA: Diagnosis not present

## 2021-12-12 DIAGNOSIS — Z3689 Encounter for other specified antenatal screening: Secondary | ICD-10-CM | POA: Diagnosis not present

## 2021-12-12 DIAGNOSIS — O2213 Genital varices in pregnancy, third trimester: Secondary | ICD-10-CM | POA: Diagnosis not present

## 2021-12-26 DIAGNOSIS — O26849 Uterine size-date discrepancy, unspecified trimester: Secondary | ICD-10-CM | POA: Diagnosis not present

## 2021-12-26 DIAGNOSIS — Z23 Encounter for immunization: Secondary | ICD-10-CM | POA: Diagnosis not present

## 2021-12-26 DIAGNOSIS — O2213 Genital varices in pregnancy, third trimester: Secondary | ICD-10-CM | POA: Diagnosis not present

## 2021-12-26 DIAGNOSIS — O09523 Supervision of elderly multigravida, third trimester: Secondary | ICD-10-CM | POA: Diagnosis not present

## 2021-12-26 DIAGNOSIS — Z3483 Encounter for supervision of other normal pregnancy, third trimester: Secondary | ICD-10-CM | POA: Diagnosis not present

## 2022-01-09 DIAGNOSIS — O09523 Supervision of elderly multigravida, third trimester: Secondary | ICD-10-CM | POA: Diagnosis not present

## 2022-01-09 DIAGNOSIS — Z3483 Encounter for supervision of other normal pregnancy, third trimester: Secondary | ICD-10-CM | POA: Diagnosis not present

## 2022-01-23 DIAGNOSIS — O09523 Supervision of elderly multigravida, third trimester: Secondary | ICD-10-CM | POA: Diagnosis not present

## 2022-02-06 DIAGNOSIS — Z9889 Other specified postprocedural states: Secondary | ICD-10-CM | POA: Diagnosis not present

## 2022-02-06 DIAGNOSIS — O09523 Supervision of elderly multigravida, third trimester: Secondary | ICD-10-CM | POA: Diagnosis not present

## 2022-02-14 DIAGNOSIS — O09523 Supervision of elderly multigravida, third trimester: Secondary | ICD-10-CM | POA: Diagnosis not present

## 2022-02-14 DIAGNOSIS — Z3483 Encounter for supervision of other normal pregnancy, third trimester: Secondary | ICD-10-CM | POA: Diagnosis not present

## 2022-02-20 DIAGNOSIS — Z3483 Encounter for supervision of other normal pregnancy, third trimester: Secondary | ICD-10-CM | POA: Diagnosis not present

## 2022-02-20 DIAGNOSIS — O09523 Supervision of elderly multigravida, third trimester: Secondary | ICD-10-CM | POA: Diagnosis not present

## 2022-02-24 ENCOUNTER — Other Ambulatory Visit: Payer: Self-pay

## 2022-02-24 ENCOUNTER — Encounter (HOSPITAL_COMMUNITY): Payer: Self-pay

## 2022-02-24 ENCOUNTER — Inpatient Hospital Stay (HOSPITAL_COMMUNITY): Payer: BC Managed Care – PPO | Admitting: Anesthesiology

## 2022-02-24 ENCOUNTER — Inpatient Hospital Stay (HOSPITAL_COMMUNITY)
Admission: AD | Admit: 2022-02-24 | Discharge: 2022-02-26 | DRG: 807 | Disposition: A | Discharge status: Home / Self Care | Payer: BC Managed Care – PPO | Attending: Obstetrics and Gynecology | Admitting: Obstetrics and Gynecology

## 2022-02-24 DIAGNOSIS — O9902 Anemia complicating childbirth: Secondary | ICD-10-CM | POA: Diagnosis not present

## 2022-02-24 DIAGNOSIS — D649 Anemia, unspecified: Secondary | ICD-10-CM | POA: Diagnosis not present

## 2022-02-24 DIAGNOSIS — Z3A38 38 weeks gestation of pregnancy: Secondary | ICD-10-CM | POA: Diagnosis not present

## 2022-02-24 DIAGNOSIS — O26893 Other specified pregnancy related conditions, third trimester: Secondary | ICD-10-CM | POA: Diagnosis not present

## 2022-02-24 DIAGNOSIS — Z3A39 39 weeks gestation of pregnancy: Secondary | ICD-10-CM | POA: Diagnosis not present

## 2022-02-24 LAB — CBC
HCT: 36.9 % (ref 36.0–46.0)
Hemoglobin: 12.2 g/dL (ref 12.0–15.0)
MCH: 31.2 pg (ref 26.0–34.0)
MCHC: 33.1 g/dL (ref 30.0–36.0)
MCV: 94.4 fL (ref 80.0–100.0)
Platelets: 199 10*3/uL (ref 150–400)
RBC: 3.91 MIL/uL (ref 3.87–5.11)
RDW: 13.3 % (ref 11.5–15.5)
WBC: 9.4 10*3/uL (ref 4.0–10.5)
nRBC: 0 % (ref 0.0–0.2)

## 2022-02-24 LAB — OB RESULTS CONSOLE GBS: GBS: NEGATIVE

## 2022-02-24 LAB — OB RESULTS CONSOLE RUBELLA ANTIBODY, IGM: Rubella: IMMUNE

## 2022-02-24 LAB — TYPE AND SCREEN
ABO/RH(D): A POS
Antibody Screen: NEGATIVE

## 2022-02-24 LAB — POCT FERN TEST: POCT Fern Test: POSITIVE

## 2022-02-24 LAB — OB RESULTS CONSOLE RPR: RPR: NONREACTIVE

## 2022-02-24 LAB — OB RESULTS CONSOLE HIV ANTIBODY (ROUTINE TESTING): HIV: NONREACTIVE

## 2022-02-24 LAB — OB RESULTS CONSOLE HEPATITIS B SURFACE ANTIGEN: Hepatitis B Surface Ag: NEGATIVE

## 2022-02-24 MED ORDER — TERBUTALINE SULFATE 1 MG/ML IJ SOLN: 0.2500 mg | Freq: Once | INTRAMUSCULAR | Status: DC | PRN

## 2022-02-24 MED ORDER — OXYCODONE-ACETAMINOPHEN 5-325 MG PO TABS: 2.0000 | ORAL_TABLET | ORAL | Status: DC | PRN

## 2022-02-24 MED ORDER — OXYTOCIN BOLUS FROM INFUSION: 333.0000 mL | Freq: Once | INTRAVENOUS | Status: DC

## 2022-02-24 MED ORDER — OXYTOCIN-SODIUM CHLORIDE 30-0.9 UT/500ML-% IV SOLN: 2.5000 [IU]/h | INTRAVENOUS | Status: DC

## 2022-02-24 MED ORDER — FENTANYL CITRATE (PF) 100 MCG/2ML IJ SOLN
100.0000 ug | INTRAMUSCULAR | Status: DC | PRN
  Administered 2022-02-24: 100 ug via INTRAVENOUS
  Filled 2022-02-24: qty 2

## 2022-02-24 MED ORDER — SOD CITRATE-CITRIC ACID 500-334 MG/5ML PO SOLN
30.0000 mL | ORAL | Status: DC | PRN
Start: 2022-02-24 — End: 2022-02-25

## 2022-02-24 MED ORDER — OXYTOCIN-SODIUM CHLORIDE 30-0.9 UT/500ML-% IV SOLN
1.0000 m[IU]/min | INTRAVENOUS | Status: DC
  Administered 2022-02-24: 2 m[IU]/min via INTRAVENOUS
  Filled 2022-02-24: qty 500

## 2022-02-24 MED ORDER — ACETAMINOPHEN 325 MG PO TABS: 650.0000 mg | ORAL_TABLET | ORAL | Status: DC | PRN

## 2022-02-24 MED ORDER — LIDOCAINE HCL (PF) 1 % IJ SOLN: 30.0000 mL | INTRAMUSCULAR | Status: DC | PRN

## 2022-02-24 MED ORDER — LACTATED RINGERS IV SOLN: 500.0000 mL | INTRAVENOUS | Status: DC | PRN

## 2022-02-24 MED ORDER — DIPHENHYDRAMINE HCL 50 MG/ML IJ SOLN: 12.5000 mg | INTRAMUSCULAR | Status: DC | PRN

## 2022-02-24 MED ORDER — PHENYLEPHRINE 80 MCG/ML (10ML) SYRINGE FOR IV PUSH (FOR BLOOD PRESSURE SUPPORT)
80.0000 ug | PREFILLED_SYRINGE | INTRAVENOUS | Status: DC | PRN
  Filled 2022-02-24: qty 10

## 2022-02-24 MED ORDER — EPHEDRINE 5 MG/ML INJ: 10.0000 mg | INTRAVENOUS | Status: DC | PRN

## 2022-02-24 MED ORDER — ONDANSETRON HCL 4 MG/2ML IJ SOLN: 4.0000 mg | Freq: Four times a day (QID) | INTRAMUSCULAR | Status: DC | PRN

## 2022-02-24 MED ORDER — LACTATED RINGERS IV SOLN: INTRAVENOUS | Status: DC

## 2022-02-24 MED ORDER — OXYTOCIN 10 UNIT/ML IJ SOLN: 10.0000 [IU] | Freq: Once | INTRAMUSCULAR | Status: DC

## 2022-02-24 MED ORDER — LACTATED RINGERS IV SOLN: 500.0000 mL | Freq: Once | INTRAVENOUS | Status: DC

## 2022-02-24 MED ORDER — OXYCODONE-ACETAMINOPHEN 5-325 MG PO TABS: 1.0000 | ORAL_TABLET | ORAL | Status: DC | PRN

## 2022-02-24 MED ORDER — FENTANYL-BUPIVACAINE-NACL 0.5-0.125-0.9 MG/250ML-% EP SOLN
12.0000 mL/h | EPIDURAL | Status: DC | PRN
  Administered 2022-02-24: 11 mL/h via EPIDURAL
  Filled 2022-02-24: qty 250

## 2022-02-24 MED ORDER — PHENYLEPHRINE 80 MCG/ML (10ML) SYRINGE FOR IV PUSH (FOR BLOOD PRESSURE SUPPORT): 80.0000 ug | PREFILLED_SYRINGE | INTRAVENOUS | Status: DC | PRN

## 2022-02-24 MED ORDER — LIDOCAINE HCL (PF) 1 % IJ SOLN
INTRAMUSCULAR | Status: DC | PRN
  Administered 2022-02-24: 5 mL via EPIDURAL
  Administered 2022-02-24: 4 mL via EPIDURAL

## 2022-02-24 NOTE — Progress Notes (Signed)
S: reports ctx getting more painful ? Declines epidural ? ?O: BP (!) 101/56   Pulse 77   Temp 98.5 ?F (36.9 ?C) (Oral)   Resp 17   Ht 5\' 1"  (1.549 m)   Wt 68 kg   SpO2 100%   BMI 28.34 kg/m?   ?Pitocin 4 miu ?VE 4/70/-1 LOA asynclitic ? ?Tracing: baseline 145 (+) accel  ? 150 ? Ctx q 3- 3 1/2 mins ? ?IMP Latent phase ?SROM ?Term gestation ?P) right exaggerated sims position ? Cont pitocin ?

## 2022-02-24 NOTE — Progress Notes (Signed)
S: more comfortable after epidural ? ?O: BP 113/68   Pulse 92   Temp 98.6 ?F (37 ?C) (Oral)   Resp 18   Ht 5\' 1"  (1.549 m)   Wt 68 kg   SpO2 100%   BMI 28.34 kg/m?  ? Pitocin ?VE 7/80/-1 mid position per RN ? Tracing: baseline 130 (+) early decel ? Good variability ) accel ? Ctx q 2-3 mins ? ? ?IMP: protracted active phase ?SROm clear fluid ?Term ?P) cont with pitocin, exaggerated sims position ?

## 2022-02-24 NOTE — H&P (Signed)
Charlotte Giles is a 36 y.o. female presenting @ 17 6/[redacted] wk gestation with SROM clear fluid. Occ ctx. GBS cx neg,  ?OB History   ? ? Gravida  ?3  ? Para  ?2  ? Term  ?2  ? Preterm  ?   ? AB  ?0  ? Living  ?2  ?  ? ? SAB  ?   ? IAB  ?   ? Ectopic  ?0  ? Multiple  ?0  ? Live Births  ?2  ?   ?  ?  ? ?Past Medical History:  ?Diagnosis Date  ? Acute blood loss anemia 12/12/2013  ? Anemia   ? Cervical dysplasia, mild 12/18/2010  ? High risk HPV infection 11/2010  ? HSV infection   ? ?Past Surgical History:  ?Procedure Laterality Date  ? HERNIA REPAIR    ? LAPAROSCOPIC APPENDECTOMY N/A 02/25/2018  ? Procedure: APPENDECTOMY LAPAROSCOPIC;  Surgeon: Manus Rudd, MD;  Location: [facility] OR;  Service: General;  Laterality: N/A;  ? ?Family History: family history includes Cancer (age of onset: 36) in her father; Diabetes in her maternal grandmother; Hypertension in her maternal grandmother and mother; Ovarian cancer in her mother; Sudden Cardiac Death (age of onset: 65) in her maternal uncle. ?Social History:  reports that she has never smoked. She has never used smokeless tobacco. She reports current alcohol use. She reports that she does not use drugs. ? ? ?  ?Maternal Diabetes: No ?Genetic Screening: Declined ?Maternal Ultrasounds/Referrals: Normal ?Fetal Ultrasounds or other Referrals:  None ?Maternal Substance Abuse:  No ?Significant Maternal Medications:  None ?Significant Maternal Lab Results:  Group B Strep negative ?Other Comments:  None ? ?Review of Systems  ?All other systems reviewed and are negative. ?History ?Dilation: 2.5 ?Effacement (%): 50 ?Station: -3 ?Exam by:: Agustin Cree, RN ?Blood pressure (!) 123/57, pulse 72, temperature 98.3 ?F (36.8 ?C), temperature source Oral, resp. rate 18, height 5\' 1"  (1.549 m), weight 68 kg, SpO2 100 %, unknown if currently breastfeeding. ?Exam ?Physical Exam ?Constitutional:   ?   Appearance: Normal appearance.  ?HENT:  ?   Head: Atraumatic.  ?Eyes:  ?   Extraocular  Movements: Extraocular movements intact.  ?Cardiovascular:  ?   Rate and Rhythm: Regular rhythm.  ?   Heart sounds: Normal heart sounds.  ?Abdominal:  ?   Palpations: Abdomen is soft.  ?Musculoskeletal:  ?   Cervical back: Neck supple.  ?Skin: ?   General: Skin is warm.  ?Neurological:  ?   Mental Status: She is alert and oriented to person, place, and time.  ?Psychiatric:     ?   Mood and Affect: Mood normal.  ?  ?Prenatal labs: ?ABO, Rh: --/--/A POS (05/06 07-26-1983) ?Antibody: NEG (05/06 07-26-1983) ?Rubella:  Immune ?RPR:   NR ?HBsAg:  neg  ?HIV:   neg ?GBS: Negative/-- (05/06 0000)  ? ?Assessment/Plan: ?SROM ?Term gestation ?P) admit routine labs. Declines pitocin  ? ?Charlotte Giles A Charlotte Giles ?02/24/2022, 9:22 AM ? ? ? ? ?

## 2022-02-24 NOTE — MAU Note (Signed)
Charlotte Giles is a 36 y.o. at [redacted]w[redacted]d here in MAU reporting: water broke this AM, still leaking. Fluid is clear with a little bit of blood. No contractions, some BHC. +FM ? ?Onset of complaint: today ? ?Pain score: 0/10 ? ?Vitals:  ? 02/24/22 0729  ?BP: 116/76  ?Pulse: 71  ?Resp: 18  ?Temp: 98.3 ?F (36.8 ?C)  ?SpO2: 100%  ?   ?FHT:EFM applied in room ? ?Lab orders placed from triage: none ? ?

## 2022-02-24 NOTE — Anesthesia Preprocedure Evaluation (Signed)
Anesthesia Evaluation  ?Patient identified by MRN, date of birth, ID band ?Patient awake ? ? ? ?Reviewed: ?Allergy & Precautions, Patient's Chart, lab work & pertinent test results ? ?Airway ?Mallampati: I ? ?TM Distance: >3 FB ?Neck ROM: Full ? ? ? Dental ?no notable dental hx. ?(+) Teeth Intact, Dental Advisory Given ?  ?Pulmonary ?neg pulmonary ROS,  ?  ?Pulmonary exam normal ?breath sounds clear to auscultation ? ? ? ? ? ? Cardiovascular ?negative cardio ROS ?Normal cardiovascular exam ?Rhythm:Regular Rate:Normal ? ? ?  ?Neuro/Psych ?negative neurological ROS ? negative psych ROS  ? GI/Hepatic ?Neg liver ROS, GERD  ,  ?Endo/Other  ?negative endocrine ROS ? Renal/GU ?negative Renal ROS  ?negative genitourinary ?  ?Musculoskeletal ?negative musculoskeletal ROS ?(+)  ? Abdominal ?  ?Peds ? Hematology ? ?(+) Blood dyscrasia, anemia ,   ?Anesthesia Other Findings ? ? Reproductive/Obstetrics ?(+) Pregnancy ?HPV ?HSV ? ?  ? ? ? ? ? ? ? ? ? ? ? ? ? ?  ?  ? ? ? ? ? ? ? ? ?Anesthesia Physical ?Anesthesia Plan ? ?ASA: 2 ? ?Anesthesia Plan: Epidural  ? ?Post-op Pain Management:   ? ?Induction:  ? ?PONV Risk Score and Plan: Treatment may vary due to age or medical condition ? ?Airway Management Planned:  ? ?Additional Equipment:  ? ?Intra-op Plan:  ? ?Post-operative Plan:  ? ?Informed Consent: I have reviewed the patients History and Physical, chart, labs and discussed the procedure including the risks, benefits and alternatives for the proposed anesthesia with the patient or authorized representative who has indicated his/her understanding and acceptance.  ? ? ? ? ? ?Plan Discussed with: Anesthesiologist ? ?Anesthesia Plan Comments:   ? ? ? ? ? ? ?Anesthesia Quick Evaluation ? ?

## 2022-02-24 NOTE — Anesthesia Procedure Notes (Signed)
Epidural ?Patient location during procedure: OB ?Start time: 02/24/2022 10:31 PM ?End time: 02/24/2022 10:39 PM ? ?Staffing ?Anesthesiologist: Mal Amabile, MD ? ?Preanesthetic Checklist ?Completed: patient identified, IV checked, site marked, risks and benefits discussed, surgical consent, monitors and equipment checked, pre-op evaluation and timeout performed ? ?Epidural ?Patient position: sitting ?Prep: DuraPrep and site prepped and draped ?Patient monitoring: continuous pulse ox and blood pressure ?Approach: midline ?Location: L3-L4 ?Injection technique: LOR air ? ?Needle:  ?Needle type: Tuohy  ?Needle gauge: 17 G ?Needle length: 9 cm and 9 ?Needle insertion depth: 4 cm ?Catheter type: closed end flexible ?Catheter size: 19 Gauge ?Catheter at skin depth: 9 cm ?Test dose: negative and Other ? ?Assessment ?Events: blood not aspirated, injection not painful, no injection resistance, no paresthesia and negative IV test ? ?Additional Notes ?Patient identified. Risks and benefits discussed including failed block, incomplete  ?Pain control, post dural puncture headache, nerve damage, paralysis, blood pressure ?Changes, nausea, vomiting, reactions to medications-both toxic and allergic and post ?Partum back pain. All questions were answered. Patient expressed understanding and wished to proceed. Sterile technique was used throughout procedure. Epidural site was ?Dressed with sterile barrier dressing. No paresthesias, signs of intravascular injection ?Or signs of intrathecal spread were encountered.  ?Patient was more comfortable after the epidural was dosed. ?Please see RN's note for documentation of vital signs and FHR which are stable. ?Reason for block:procedure for pain ? ? ? ?

## 2022-02-25 ENCOUNTER — Encounter (HOSPITAL_COMMUNITY): Payer: Self-pay | Admitting: Obstetrics and Gynecology

## 2022-02-25 LAB — RPR: RPR Ser Ql: NONREACTIVE

## 2022-02-25 MED ORDER — DIBUCAINE (PERIANAL) 1 % EX OINT: 1.0000 "application " | TOPICAL_OINTMENT | CUTANEOUS | Status: DC | PRN

## 2022-02-25 MED ORDER — BENZOCAINE-MENTHOL 20-0.5 % EX AERO: 1.0000 "application " | INHALATION_SPRAY | CUTANEOUS | Status: DC | PRN

## 2022-02-25 MED ORDER — COCONUT OIL OIL
1.0000 | TOPICAL_OIL | Status: DC | PRN
Start: 2022-02-25 — End: 2022-02-26

## 2022-02-25 MED ORDER — ACETAMINOPHEN 325 MG PO TABS
650.0000 mg | ORAL_TABLET | ORAL | Status: DC | PRN
Start: 2022-02-25 — End: 2022-02-26

## 2022-02-25 MED ORDER — FERROUS SULFATE 325 (65 FE) MG PO TABS
325.0000 mg | ORAL_TABLET | Freq: Two times a day (BID) | ORAL | Status: DC
  Administered 2022-02-26: 325 mg via ORAL
  Filled 2022-02-25 (×3): qty 1

## 2022-02-25 MED ORDER — SIMETHICONE 80 MG PO CHEW: 80.0000 mg | CHEWABLE_TABLET | ORAL | Status: DC | PRN

## 2022-02-25 MED ORDER — SENNOSIDES-DOCUSATE SODIUM 8.6-50 MG PO TABS
2.0000 | ORAL_TABLET | Freq: Every day | ORAL | Status: DC
  Administered 2022-02-25 – 2022-02-26 (×2): 2 via ORAL
  Filled 2022-02-25: qty 2

## 2022-02-25 MED ORDER — ONDANSETRON HCL 4 MG PO TABS: 4.0000 mg | ORAL_TABLET | ORAL | Status: DC | PRN

## 2022-02-25 MED ORDER — ZOLPIDEM TARTRATE 5 MG PO TABS: 5.0000 mg | ORAL_TABLET | Freq: Every evening | ORAL | Status: DC | PRN

## 2022-02-25 MED ORDER — PRENATAL MULTIVITAMIN CH
1.0000 | ORAL_TABLET | Freq: Every day | ORAL | Status: DC
  Administered 2022-02-25 – 2022-02-26 (×2): 1 via ORAL
  Filled 2022-02-25 (×2): qty 1

## 2022-02-25 MED ORDER — WITCH HAZEL-GLYCERIN EX PADS: 1.0000 "application " | MEDICATED_PAD | CUTANEOUS | Status: DC | PRN

## 2022-02-25 MED ORDER — IBUPROFEN 600 MG PO TABS
600.0000 mg | ORAL_TABLET | Freq: Four times a day (QID) | ORAL | Status: DC
  Administered 2022-02-25 – 2022-02-26 (×5): 600 mg via ORAL
  Filled 2022-02-25 (×6): qty 1

## 2022-02-25 MED ORDER — ONDANSETRON HCL 4 MG/2ML IJ SOLN: 4.0000 mg | INTRAMUSCULAR | Status: DC | PRN

## 2022-02-25 MED ORDER — DIPHENHYDRAMINE HCL 25 MG PO CAPS: 25.0000 mg | ORAL_CAPSULE | Freq: Four times a day (QID) | ORAL | Status: DC | PRN

## 2022-02-25 NOTE — Progress Notes (Signed)
PPD0 SVD:  ? ?S:  Pt reports feeling well/ Tolerating po/ Voiding without problems/ No n/v/ Bleeding is light/ Pain controlled withprescription NSAID's including ibuprofen (Motrin) ? Newborn info live female BRF ? ? ?O:  A & O x 3 / VS: Blood pressure 95/60, pulse 78, temperature 98.9 ?F (37.2 ?C), temperature source Oral, resp. rate 18, height 5\' 1"  (1.549 m), weight 68 kg, SpO2 100 %, unknown if currently breastfeeding. ? LABS: No results found for this or any previous visit (from the past 24 hour(s)). ? I&O: I/O last 3 completed shifts: ?In: -  ?Out: 300 [Urine:200; Blood:100] ?  No intake/output data recorded. ? Lungs: chest clear, no wheezing, rales, normal symmetric air entry ? Heart: regular rate and rhythm, S1, S2 normal, no murmur, click, rub or gallop ? Abdomen: uterus firm at umb nontender ? Perineum: not inspected ? Lochia: light ? Extremities:no redness or tenderness in the calves or thighs, no edema ?  ? ?A/P: PPD # 0/ ? Doing well ? Continue routine post partum orders ?   ?

## 2022-02-25 NOTE — Lactation Note (Signed)
This note was copied from a baby's chart. ?Lactation Consultation Note ?Experienced BF mom but has been 6 yrs last child BF. Baby off and on. LC repositioned in football hold for deeper latch d/t painful latch in cradle hold. Baby finally obtained deeper latch w/no pain after flanging lips more. ?Baby BF well when LC left. ?Mom w/be f/u on MBU. ? ?Patient Name: [name] ?Today's Date: 02/25/2022 ?Reason for consult: L&D Initial assessment;Term ?Age:35 hours ? ?Maternal Data ?Does the patient have breastfeeding experience prior to this delivery?: Yes ?How long did the patient breastfeed?: 36 yr old 78 months, 36 yr old 6 months ? ?Feeding ?  ? ?LATCH Score ?Latch: Grasps breast easily, tongue down, lips flanged, rhythmical sucking. ? ?Audible Swallowing: None ? ?Type of Nipple: Everted at rest and after stimulation ? ?Comfort (Breast/Nipple): Soft / non-tender ? ?Hold (Positioning): Assistance needed to correctly position infant at breast and maintain latch. ? ?LATCH Score: 7 ? ? ?Lactation Tools Discussed/Used ?  ? ?Interventions ?Interventions: Adjust position;Assisted with latch;Support pillows;Skin to skin;Position options;Breast massage;Breast compression ? ?Discharge ?  ? ?Consult Status ?Consult Status: Follow-up from L&D ?Date: 02/25/22 ?Follow-up type: In-patient ? ? ? ?Charyl Dancer ?02/25/2022, 3:26 AM ? ? ? ?

## 2022-02-25 NOTE — Anesthesia Postprocedure Evaluation (Signed)
Anesthesia Post Note ? ?Patient: Charlotte Giles ? ?Procedure(s) Performed: AN AD HOC LABOR EPIDURAL ? ?  ? ?Patient location during evaluation: Mother Baby ?Anesthesia Type: Epidural ?Level of consciousness: awake and alert ?Pain management: pain level controlled ?Vital Signs Assessment: post-procedure vital signs reviewed and stable ?Respiratory status: spontaneous breathing, nonlabored ventilation and respiratory function stable ?Cardiovascular status: stable ?Postop Assessment: no headache, no backache, epidural receding, no apparent nausea or vomiting, patient able to bend at knees, adequate PO intake and able to ambulate ?Anesthetic complications: no ? ? ?No notable events documented. ? ?Last Vitals:  ?Vitals:  ? 02/25/22 0523 02/25/22 0953  ?BP: 95/60 102/70  ?Pulse: 78 77  ?Resp: 18 18  ?Temp: 37.2 ?C 37.1 ?C  ?SpO2: 100% 100%  ?  ?Last Pain:  ?Vitals:  ? 02/25/22 0953  ?TempSrc: Oral  ?PainSc:   ? ?Pain Goal:   ? ?  ?  ?  ?  ?  ?  ?  ? ?Blasa Raisch Hristova ? ? ? ? ?

## 2022-02-25 NOTE — Lactation Note (Signed)
This note was copied from a baby's chart. ?Lactation Consultation Note ? ?Patient Name: Charlotte Giles ?Today's Date: 02/25/2022 ?Reason for consult: Initial assessment;Term ?Age:35 hours ? ?Mom was attempting to latch infant when I walked into room. I assisted with latching, but Mom was able to get infant latched on her own. Infant immediately began swallowing with a 1:1 suck:swallow ratio. Mom was comfortable with latch, but did feel uterine cramping.  ? ?RN had provided a hand pump; I provided a size 27 flange in case that was the better fit for her. At this time, I do not think Mom needs to use the hand pump unless she desires to do so or unless infant becomes too sleepy to feed. ? ?Mom was made aware of O/P services, breastfeeding support groups, community resources (including [employer]), and our phone # for post-discharge questions.  ? ? ?Maternal Data ?Does the patient have breastfeeding experience prior to this delivery?: Yes ?How long did the patient breastfeed?: 9 months & 6 months, respectively ? ?Lactation Tools Discussed/Used ?Tools: Flanges ?Flange Size: 27 ? ?Interventions ?Interventions: Education;LC Services brochure ? ?Discharge ?Pump: Manual;Personal (Mom has a [employer]in[employer] pump at home) ? ?Consult Status ?Consult Status: PRN ?Follow-up type: In-patient ? ? ? ?Lurline Hare [place] ?02/25/2022, 11:04 AM ? ? ? ?

## 2022-02-26 LAB — CBC
HCT: 27.9 % — ABNORMAL LOW (ref 36.0–46.0)
Hemoglobin: 9.5 g/dL — ABNORMAL LOW (ref 12.0–15.0)
MCH: 31.7 pg (ref 26.0–34.0)
MCHC: 34.1 g/dL (ref 30.0–36.0)
MCV: 93 fL (ref 80.0–100.0)
Platelets: 156 10*3/uL (ref 150–400)
RBC: 3 MIL/uL — ABNORMAL LOW (ref 3.87–5.11)
RDW: 13.4 % (ref 11.5–15.5)
WBC: 15.1 10*3/uL — ABNORMAL HIGH (ref 4.0–10.5)
nRBC: 0 % (ref 0.0–0.2)

## 2022-02-26 NOTE — Discharge Instructions (Signed)
Call if temperature greater than equal to 100.4, nothing per vagina for 4-6 weeks or severe nausea vomiting, increased incisional pain , drainage or redness in the incision site, no straining with bowel movements, showers no bath °

## 2022-02-26 NOTE — Progress Notes (Signed)
PPD2 SVD:  ? ?S:  Pt reports feeling well/ Tolerating po/ Voiding without problems/ No n/v/ Bleeding is light/ Pain controlled withprescription NSAID's including ibuprofen (Motrin) ? Newborn info live female BRF ? ? ?O:  A & O x 3 / VS: Blood pressure 109/70, pulse 78, temperature 98.3 ?F (36.8 ?C), temperature source Oral, resp. rate 16, height 5\' 1"  (1.549 m), weight 68 kg, SpO2 98 %, unknown if currently breastfeeding. ? LABS:  ?Results for orders placed or performed during the hospital encounter of 02/24/22 (from the past 24 hour(s))  ?CBC     Status: Abnormal  ? Collection Time: 02/26/22  5:48 AM  ?Result Value Ref Range  ? WBC 15.1 (H) 4.0 - 10.5 K/uL  ? RBC 3.00 (L) 3.87 - 5.11 MIL/uL  ? Hemoglobin 9.5 (L) 12.0 - 15.0 g/dL  ? HCT 27.9 (L) 36.0 - 46.0 %  ? MCV 93.0 80.0 - 100.0 fL  ? MCH 31.7 26.0 - 34.0 pg  ? MCHC 34.1 30.0 - 36.0 g/dL  ? RDW 13.4 11.5 - 15.5 %  ? Platelets 156 150 - 400 K/uL  ? nRBC 0.0 0.0 - 0.2 %  ? ? I&O: I/O last 3 completed shifts: ?In: -  ?Out: 300 [Urine:200; Blood:100] ?  No intake/output data recorded. ? Lungs: chest clear, no wheezing, rales, normal symmetric air entry ? Heart: regular rate and rhythm, S1, S2 normal, no murmur, click, rub or gallop ? Abdomen: soft uterus  firm at umb ? Perineum: healing with good reapproximation ? Lochia: light ? Extremities:no redness or tenderness in the calves or thighs, no edema ?  ? ?A/P: PPD # 2/ 04/28/22 ? Doing well ? Continue routine post partum orders ? D/c instructions reviewed ?F/u 6 wk  ?

## 2022-02-26 NOTE — Discharge Summary (Signed)
? ?  Postpartum Discharge Summary ? ?Date of Service updated ? ?   ?Patient Name: Charlotte Giles ?DOB: [birth year] ?MRN: [id number] ? ?Date of admission: 02/24/2022 ?Delivery date:02/25/2022  ?Delivering provider: Quintessa Simmerman, Alanda Slim  ?Date of discharge: 02/26/2022 ? ?Admitting diagnosis: SROM, term gestation, latent phase labor ?Intrauterine pregnancy: [redacted]w[redacted]d    ?Secondary diagnosis:  Active Problems: ?  Indication for care in labor or delivery ?  Postpartum care following vaginal delivery ? ?Additional problems: n/a    ?Discharge diagnosis: Term Pregnancy Delivered                                              ?Post partum procedures: none ?Augmentation: Pitocin ?Complications: None ? ?Hospital course: Onset of Labor With Vaginal Delivery      ?36y.o. yo G3P3003 at 358w0das admitted in Latent Labor on 02/24/2022. Patient had a complicated labor course as follows:  pitocin augmentation after several hours of awaiting spontaneous labor. Epidural after arrest of dilation  ?Membrane Rupture Time/Date: 6:00 AM ,02/24/2022   ?Delivery Method:Vaginal, Spontaneous  ?Episiotomy: None  ?Lacerations:  None  ?Patient had an uncomplicated postpartum course.  She is ambulating, tolerating a regular diet, passing flatus, and urinating well. Patient is discharged home in stable condition on 02/26/2022 ? ?Newborn Data: ?Birth date:02/25/2022  ?Birth time:2:24 AM  ?Gender:Female  ?Living status:Living  ?Apgars:9 ,9  ?Weight:3350 g  ? ?Magnesium Sulfate received: No ?BMZ received: No ?Rhophylac:No ?MMR:No ?T-DaP:Given prenatally ?Flu: No ?Transfusion:No ? ?Physical exam  ?Vitals:  ? 02/25/22 1428 02/25/22 1730 02/25/22 2100 02/26/22 0500  ?BP: 101/69 108/74 105/66 109/70  ?Pulse: 89 89 76 78  ?Resp: _0 ?Temp: 98 ?F (36.7 ?C) 98.2 ?F (36.8 ?C) 98.2 ?F (36.8 ?C) 98.3 ?F (36.8 ?C)  ?TempSrc: Oral Oral Oral Oral  ?SpO2: 100% 100% 97% 98%  ?Weight:      ?Height:      ? ?General: alert, cooperative, and no distress ?Lochia:  appropriate ?Uterine Fundus: firm ?Incision: N/A ?DVT Evaluation: No evidence of DVT seen on physical exam. ?No significant calf/ankle edema. ?Labs: ?Lab Results  ?Component Value Date  ? WBC 15.1 (H) 02/26/2022  ? HGB 9.5 (L) 02/26/2022  ? HCT 27.9 (L) 02/26/2022  ? MCV 93.0 02/26/2022  ? PLT 156 02/26/2022  ? ? ?  Latest Ref Rng & Units 11/11/2020  ?  4:07 PM  ?CMP  ?Glucose 65 - 99 mg/dL 94    ?BUN 7 - 25 mg/dL 15    ?Creatinine 0.50 - 1.10 mg/dL 0.97    ?Sodium 135 - 146 mmol/L 140    ?Potassium 3.5 - 5.3 mmol/L 3.9    ?Chloride 98 - 110 mmol/L 104    ?CO2 20 - 32 mmol/L 23    ?Calcium 8.6 - 10.2 mg/dL 9.6    ?Total Protein 6.1 - 8.1 g/dL 7.2    ?Total Bilirubin 0.2 - 1.2 mg/dL 0.7    ?AST 10 - 30 U/L 17    ?ALT 6 - 29 U/L 12    ? ?Edinburgh Score: ? ?  02/25/2022  ?  4:20 AM  ?EdFlavia Shipperostnatal Depression Scale Screening Tool  ?I have been able to laugh and see the funny side of things. 0  ?I have looked forward with enjoyment to things. 0  ?I have blamed myself unnecessarily when things went wrong.  1  ?I have been anxious or worried for no good reason. 1  ?I have felt scared or panicky for no good reason. 0  ?Things have been getting on top of me. 1  ?I have been so unhappy that I have had difficulty sleeping. 0  ?I have felt sad or miserable. 0  ?I have been so unhappy that I have been crying. 0  ?The thought of harming myself has occurred to me. 0  ?Edinburgh Postnatal Depression Scale Total 3  ? ? ? ? ?After visit meds:  ?Allergies as of 02/26/2022   ?No Known Allergies ?  ? ?  ?Medication List  ?  ? ?TAKE these medications   ? ?prenatal multivitamin Tabs tablet ?Take 1 tablet by mouth daily at 12 noon. ?  ? ?  ? ? ? ?Discharge home in stable condition ?Infant Feeding: Breast ?Infant Disposition:home with mother ?Discharge instruction: per After Visit Summary and Postpartum booklet. ?Activity: Advance as tolerated. Pelvic rest for 6 weeks.  ?Diet: routine diet ?Anticipated Birth Control: Unsure ?Postpartum  Appointment:6 weeks ?Additional Postpartum F/U:  n/a ?Future Appointments:No future appointments. ?Follow up Visit: ? Follow-up Information   ? ? Cousins, Sheronette, MD Follow up in 6 week(s).   ?Specialty: Obstetrics and Gynecology ?Contact information: ?[address] ?[address] ?[place] [address] ?[phone] ? ? ?  ?  ? ?  ?  ? ?  ? ? ? ?  ? ?02/26/2022 ?Sheronette A Cousins, MD ? ? ?

## 2022-02-26 NOTE — Lactation Note (Signed)
This note was copied from a baby's chart. ?Lactation Consultation Note ? ?Patient Name: Charlotte Giles ?Today's Date: 02/26/2022 ?Reason for consult: Follow-up assessment;Term;Infant weight loss;Other (Comment) (4 % weight loss, baby has a D/C and mom waiting on her doctor D/C. Mom is Breast / formula. see note for details.) ?Age:35 hours ?LC reviewed supply and demand .  ?Mom is an experienced BF . LC recommended the option if baby is still hungry after the 1st breast offer the 2nd breast. If baby is satisfied, hold off on supplementing. If not offer EBM or formula and keep it had 30 ml or lower.  ?LC also recommended so baby is at the breast more prior to feeding - breast massage, hand express, pre- pump, latch.  ?LC reviewed prevention of engorgement therefore decreasing potential for mastitis.  ?Per mom had it x 2 with her other 2 babies.  ?Mom requested for LC to assess breast tissue and both nipples, areola edema.  ?LC provided shells between feedings while awake/ and reviewed the set up of the hand pump .  ? ?Maternal Data ?  ? ?Feeding ?Mother's Current Feeding Choice: Breast Milk and Formula ?Nipple Type: Nfant Standard Flow (white) ? ?LATCH Score ?  ? ?  ? ?  ? ?  ? ?  ? ?  ? ? ?Lactation Tools Discussed/Used ?Tools: Pump;Flanges ?Flange Size: 24 ?Breast pump type: Manual ?Pump Education: Setup, frequency, and cleaning;Milk Storage ?Reason for Pumping: PRN ? ?Interventions ?Interventions: Breast feeding basics reviewed;Shells;Hand pump;Education;LC Services brochure ? ?Discharge ?Discharge Education: Engorgement and breast care ?Pump: DEBP;Manual;Personal ? ?Consult Status ?Consult Status: Complete ?Date: 02/26/22 ? ? ? ?Matilde Sprang Tmya Wigington ?02/26/2022, 12:40 PM ? ? ? ?

## 2022-03-04 ENCOUNTER — Inpatient Hospital Stay (HOSPITAL_COMMUNITY): Admit: 2022-03-04 | Payer: Self-pay

## 2022-03-06 ENCOUNTER — Telehealth (HOSPITAL_COMMUNITY): Payer: Self-pay | Admitting: *Deleted

## 2022-03-06 NOTE — Telephone Encounter (Signed)
Mom reports feeling good. No concerns about herself at this time. EPDS=1 [facility] score=3) ?Mom reports baby is doing well. Feeding, peeing, and pooping without difficulty. Safe sleep reviewed. Mom reports no concerns about baby at present. ? ?Duffy Rhody, RN 03-06-2022 at 1:23pm ?

## 2022-03-16 DIAGNOSIS — R030 Elevated blood-pressure reading, without diagnosis of hypertension: Secondary | ICD-10-CM | POA: Diagnosis not present

## 2022-03-16 DIAGNOSIS — O165 Unspecified maternal hypertension, complicating the puerperium: Secondary | ICD-10-CM | POA: Diagnosis not present

## 2022-03-23 ENCOUNTER — Encounter: Payer: Self-pay | Admitting: Cardiology

## 2022-03-23 ENCOUNTER — Ambulatory Visit (INDEPENDENT_AMBULATORY_CARE_PROVIDER_SITE_OTHER): Payer: BC Managed Care – PPO | Admitting: Cardiology

## 2022-03-23 VITALS — BP 142/100 | HR 71 | Ht 61.0 in | Wt 137.9 lb

## 2022-03-23 DIAGNOSIS — O165 Unspecified maternal hypertension, complicating the puerperium: Secondary | ICD-10-CM

## 2022-03-23 MED ORDER — LABETALOL HCL 200 MG PO TABS: 200.0000 mg | ORAL_TABLET | Freq: Two times a day (BID) | ORAL | 0 refills | Status: DC

## 2022-03-23 NOTE — Progress Notes (Signed)
Cardio-Obstetrics Clinic  New Evaluation  Date:  03/24/2022   ID:  Charlotte Giles, DOB 08/20/1986, MRN [id number]  PCP:  Shirline Frees, NP   [facility] Providers Cardiologist:  None  Electrophysiologist:  None       Referring MD: Shirline Frees, NP   Chief Complaint: " I am doing ok"  History of Present Illness:    Charlotte Giles is a 36 y.o. female [G3P3003] who is being seen today for the evaluation of post-partum hypertension at the request of Shirline Frees, NP.   Medical history includes iron deficiency anemia.  Was referred by her OB/GYN for postpartum hypertension.  She tells me she had not had any elevated blood pressure with her previous 2 children.  After the third child she is several months out she developed postpartum hypertension.  She has been started on labetalol.  She did bring her blood pressure readings for me that she took at home.  Her blood pressure is elevated.   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   Prenatal Vit-Fe Fumarate-FA (PRENATAL MULTIVITAMIN) TABS tablet Take 1 tablet by mouth daily at 12 noon.   [DISCONTINUED] labetalol (NORMODYNE) 100 MG tablet  Take 100 mg by mouth 2 (two) times daily.     Allergies:   Patient has no known allergies.   Social History   Socioeconomic History   Marital status: Married    Spouse name: Not on file   Number of children: Not on file   Years of education: Not on file   Highest education level: Not on file  Occupational History   Occupation: nurse  Tobacco Use   Smoking status: Never   Smokeless tobacco: Never  Vaping Use   Vaping Use: Never used  Substance and Sexual Activity   Alcohol use: Yes    Alcohol/week: 0.0 standard drinks    Comment: social    Drug use: No   Sexual activity: Yes    Partners: Male    Birth control/protection: Condom    Comment: 1st intercourse- 18, partners- 2, married- 8 yrs   Other Topics Concern   Not on file  Social History Narrative   Nurse in Medical ICU - RN    Two children - 4 and 2    Married - 6 years       Social Determinants of Dispensing optician  Resource Strain: Not on file  Food Insecurity: Not on file  Transportation Needs: Not on file  Physical Activity: Not on file  Stress: Not on file  Social Connections: Not on file      Family History  Problem Relation Age of Onset   Hypertension Mother    Ovarian cancer Mother    Cancer Father 36       PROSTATE   Diabetes Maternal Grandmother    Hypertension Maternal Grandmother    Sudden Cardiac Death Maternal Uncle 61       MI    Breast cancer Neg Hx       ROS:   Please see the history of present illness.     All other systems reviewed and are negative.   Labs/EKG Reviewed:    EKG:   EKG is was  not  ordered today.    Recent Labs: 02/26/2022: Hemoglobin 9.5; Platelets 156   Recent Lipid Panel Lab Results  Component Value Date/Time   CHOL 145 01/29/2020 08:45 AM   TRIG 38.0 01/29/2020 08:45 AM   HDL 46.30 01/29/2020 08:45 AM   CHOLHDL 3 01/29/2020 08:45 AM   LDLCALC 91 01/29/2020 08:45 AM    Physical Exam:    VS:  BP (!) 142/100 (BP Location: Right Arm, Patient  Position: Sitting, Cuff Size: Normal)   Pulse 71   Ht 5\' 1"  (1.549 m)   Wt 137 lb 14.4 oz (62.6 kg)   SpO2 98%   Breastfeeding Yes   BMI 26.06 kg/m     Wt Readings from Last 3 Encounters:  03/23/22 137 lb 14.4 oz (62.6 kg)  02/24/22 150 lb (68 kg)  12/30/20 124 lb 2 oz (56.3 kg)     GEN:  Well nourished, well developed in no acute distress HEENT: Normal NECK: No JVD; No carotid bruits LYMPHATICS: No lymphadenopathy CARDIAC: RRR, no murmurs, rubs, gallops RESPIRATORY:  Clear to auscultation without rales, wheezing or rhonchi  ABDOMEN: Soft, non-tender, non-distended MUSCULOSKELETAL:  No edema; No deformity  SKIN: Warm and dry NEUROLOGIC:  Alert and oriented x 3 PSYCHIATRIC:  Normal affect    Risk Assessment/Risk Calculators:                 ASSESSMENT & PLAN:    Post partum hypertension  Her blood pressure is still elevated despite being started on labetalol 100 mg twice a day. We will continue to take her blood pressure daily.  She will send me information in a week and if there is any need for optimizing her blood pressure medication we will do so.  Patient Instructions  Medication Instructions:  Your physician has recommended you make the following change in your medication: INCREASE LABETALOL 200MG  TWICE DAILY.   Lab Work: NONE If you have labs (blood work) drawn today and your tests are completely normal, you will receive your results only by: MyChart Message (if you have MyChart) OR A paper copy in the mail If you have any lab test that is abnormal or we need to change your treatment, we will call you to review the results.   Testing/Procedures: NONE   Follow-Up: At [facility], you and your health needs are our priority.  As part of our continuing mission to provide you with exceptional heart care, we have created designated Provider Care Teams.  These Care Teams include your primary Cardiologist (physician) and Advanced Practice Providers (APPs -   Physician Assistants and Nurse Practitioners) who all work together to provide you with  the care you need, when you need it.  We recommend signing up for the patient portal called "MyChart".  Sign up information is provided on this After Visit Summary.  MyChart is used to connect with patients for Virtual Visits (Telemedicine).  Patients are able to view lab/test results, encounter notes, upcoming appointments, etc.  Non-urgent messages can be sent to your provider as well.   To learn more about what you can do with MyChart, go to [email or web address].    Your next appointment:   12 week(s)  The format for your next appointment:   In Person  Provider:   Thomasene RippleKardie Zailyn Thoennes, DO     Other Instructions Please take your blood pressure daily for 1 weeks and send in a MyChart message. Please include heart rates.   HOW TO TAKE YOUR BLOOD PRESSURE: Rest 5 minutes before taking your blood pressure. Don't smoke or drink caffeinated beverages for at least 30 minutes before. Take your blood pressure before (not after) you eat. Sit comfortably with your back supported and both feet on the floor (don't cross your legs). Elevate your arm to heart level on a table or a desk. Use the proper sized cuff. It should fit smoothly and snugly around your bare upper arm. There should be enough room to slip a fingertip under the cuff. The bottom edge of the cuff should be 1 inch above the crease of the elbow. Ideally, take 3 measurements at one sitting and record the average.   Important Information About Sugar         Dispo:  Return in about 12 weeks (around 06/15/2022).   Medication Adjustments/Labs and Tests Ordered: Current medicines are reviewed at length with the patient today.  Concerns regarding medicines are outlined above.  Tests Ordered: No orders of the defined types were placed in this encounter.  Medication Changes: Meds ordered this encounter  Medications   labetalol (NORMODYNE) 200 MG  tablet    Sig: Take 1 tablet (200 mg total) by mouth 2 (two) times daily.    Dispense:  180 tablet    Refill:  0

## 2022-03-23 NOTE — Patient Instructions (Signed)
Medication Instructions:  Your physician has recommended you make the following change in your medication: INCREASE LABETALOL 200MG  TWICE DAILY.   Lab Work: NONE If you have labs (blood work) drawn today and your tests are completely normal, you will receive your results only by: [place] (if you have MyChart) OR A paper copy in the mail If you have any lab test that is abnormal or we need to change your treatment, we will call you to review the results.   Testing/Procedures: NONE   Follow-Up: At [facility], you and your health needs are our priority.  As part of our continuing mission to provide you with exceptional heart care, we have created designated Provider Care Teams.  These Care Teams include your primary Cardiologist (physician) and Advanced Practice Providers (APPs -  Physician Assistants and Nurse Practitioners) who all work together to provide you with the care you need, when you need it.  We recommend signing up for the patient portal called "MyChart".  Sign up information is provided on this After Visit Summary.  MyChart is used to connect with patients for Virtual Visits (Telemedicine).  Patients are able to view lab/test results, encounter notes, upcoming appointments, etc.  Non-urgent messages can be sent to your provider as well.   To learn more about what you can do with MyChart, go to [email or web address].    Your next appointment:   12 week(s)  The format for your next appointment:   In Person  Provider:   Berniece Salines, DO     Other Instructions Please take your blood pressure daily for 1 weeks and send in a MyChart message. Please include heart rates.   HOW TO TAKE YOUR BLOOD PRESSURE: Rest 5 minutes before taking your blood pressure. Don't smoke or drink caffeinated beverages for at least 30 minutes before. Take your blood pressure before (not after) you eat. Sit comfortably with your back supported and both feet on the floor (don't cross  your legs). Elevate your arm to heart level on a table or a desk. Use the proper sized cuff. It should fit smoothly and snugly around your bare upper arm. There should be enough room to slip a fingertip under the cuff. The bottom edge of the cuff should be 1 inch above the crease of the elbow. Ideally, take 3 measurements at one sitting and record the average.   Important Information About Sugar

## 2022-03-30 ENCOUNTER — Encounter: Payer: Self-pay | Admitting: Cardiology

## 2022-04-02 ENCOUNTER — Other Ambulatory Visit: Payer: Self-pay

## 2022-04-02 MED ORDER — LABETALOL HCL 200 MG PO TABS
ORAL_TABLET | ORAL | 0 refills | Status: DC
Start: 2022-04-02 — End: 2022-08-09

## 2022-04-02 NOTE — Telephone Encounter (Signed)
Medication list updated.

## 2022-06-07 ENCOUNTER — Encounter: Payer: Self-pay | Admitting: Cardiology

## 2022-06-07 ENCOUNTER — Ambulatory Visit (INDEPENDENT_AMBULATORY_CARE_PROVIDER_SITE_OTHER): Payer: BC Managed Care – PPO | Admitting: Cardiology

## 2022-06-07 VITALS — BP 118/88 | HR 58 | Ht 61.0 in | Wt 132.6 lb

## 2022-06-07 DIAGNOSIS — O165 Unspecified maternal hypertension, complicating the puerperium: Secondary | ICD-10-CM | POA: Diagnosis not present

## 2022-06-07 NOTE — Patient Instructions (Addendum)
Medication Instructions:  Your physician recommends that you continue on your current medications as directed. Please refer to the Current Medication list given to you today.   Please take your blood pressure daily and record. Please include heart rates. If your blood pressure is greater then 140/90 for two days in a row please contact my office.   HOW TO TAKE YOUR BLOOD PRESSURE: Rest 5 minutes before taking your blood pressure. Don't smoke or drink caffeinated beverages for at least 30 minutes before. Take your blood pressure before (not after) you eat. Sit comfortably with your back supported and both feet on the floor (don't cross your legs). Elevate your arm to heart level on a table or a desk. Use the proper sized cuff. It should fit smoothly and snugly around your bare upper arm. There should be enough room to slip a fingertip under the cuff. The bottom edge of the cuff should be 1 inch above the crease of the elbow. Ideally, take 3 measurements at one sitting and record the average.   Lab Work: None If you have labs (blood work) drawn today and your tests are completely normal, you will receive your results only by: MyChart Message (if you have MyChart) OR A paper copy in the mail If you have any lab test that is abnormal or we need to change your treatment, we will call you to review the results.   Testing/Procedures: None   Follow-Up: At [facility], you and your health needs are our priority.  As part of our continuing mission to provide you with exceptional heart care, we have created designated Provider Care Teams.  These Care Teams include your primary Cardiologist (physician) and Advanced Practice Providers (APPs -  Physician Assistants and Nurse Practitioners) who all work together to provide you with the care you need, when you need it.  We recommend signing up for the patient portal called "MyChart".  Sign up information is provided on this After Visit Summary.   MyChart is used to connect with patients for Virtual Visits (Telemedicine).  Patients are able to view lab/test results, encounter notes, upcoming appointments, etc.  Non-urgent messages can be sent to your provider as well.   To learn more about what you can do with MyChart, go to [email or web address].    Your next appointment:   4 month(s)  The format for your next appointment:   Virtual Visit   Provider:   Thomasene Ripple, DO   Other Instructions   Important Information About Sugar

## 2022-06-07 NOTE — Progress Notes (Signed)
Cardio-Obstetrics Clinic  Follow Up Note   Date:  06/10/2022   ID:  Charlotte Giles, DOB 12-Apr-1986, MRN [id number]  PCP:  Shirline Frees, NP   [facility] Providers Cardiologist:  None  Electrophysiologist:  None        Referring MD: Shirline Frees, NP   Chief Complaint:   History of Present Illness:    Charlotte Giles is a 36 y.o. female [G3P3003] who returns for follow up of postpartum hypertension here today for follow-up visit.  Other medical history includes iron deficiency anemia.  At her visit in June 2023 at that time she had been started on labetalol and she was doing well.  I had the patient take her labetalol 200 mg in the morning and 100 mg at night.  In the meantime she has had some low heart rates and blood pressure had dropped so she went back to taking her propanolol at 100 mg twice a day.  No complaints today.   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   Prenatal Vit-Fe Fumarate-FA (PRENATAL MULTIVITAMIN) TABS tablet Take 1 tablet by mouth daily at 12 noon.     Allergies:   Patient has no known allergies.   Social History   Socioeconomic History    Marital status: Married    Spouse name: Not on file   Number of children: Not on file   Years of education: Not on file   Highest education level: Not on file  Occupational History   Occupation: nurse  Tobacco Use   Smoking status: Never   Smokeless tobacco: Never  Vaping Use   Vaping Use: Never used  Substance and Sexual Activity   Alcohol use: Yes    Alcohol/week: 0.0 standard drinks of alcohol    Comment: social    Drug use: No   Sexual activity: Yes    Partners: Male    Birth control/protection: Condom    Comment: 1st intercourse- 18, partners- 2, married- 8 yrs   Other Topics Concern   Not on file  Social History Narrative   Nurse in Medical ICU - RN    Two children - 4 and 2    Married - 6 years       Social Determinants of Corporate investment banker Strain: Not on file  Food Insecurity: Not on Chartered certified accountant  Needs: Not on file  Physical Activity: Not on file  Stress: Not on file  Social Connections: Not on file      Family History  Problem Relation Age of Onset   Hypertension Mother    Ovarian cancer Mother    Cancer Father 34       PROSTATE   Diabetes Maternal Grandmother    Hypertension Maternal Grandmother    Sudden Cardiac Death Maternal Uncle 23       MI    Breast cancer Neg Hx       ROS:   Please see the history of present illness.     All other systems reviewed and are negative.   Labs/EKG Reviewed:    EKG:   EKG is was not ordered today.  Recent Labs: 02/26/2022: Hemoglobin 9.5; Platelets 156   Recent Lipid Panel Lab Results  Component Value Date/Time   CHOL 145 01/29/2020 08:45 AM   TRIG 38.0 01/29/2020 08:45 AM   HDL 46.30 01/29/2020 08:45 AM   CHOLHDL 3 01/29/2020 08:45 AM   LDLCALC 91 01/29/2020 08:45 AM    Physical Exam:    VS:  BP 118/88   Pulse (!) 58   Ht 5\' 1"  (1.549 m)   Wt 132 lb 9.6 oz (60.1 kg)   SpO2 99%   BMI 25.05 kg/m     Wt Readings from Last 3 Encounters:  06/07/22 132 lb 9.6 oz (60.1 kg)   03/23/22 137 lb 14.4 oz (62.6 kg)  02/24/22 150 lb (68 kg)     GEN:  Well nourished, well developed in no acute distress HEENT: Normal NECK: No JVD; No carotid bruits LYMPHATICS: No lymphadenopathy CARDIAC: RRR, no murmurs, rubs, gallops RESPIRATORY:  Clear to auscultation without rales, wheezing or rhonchi  ABDOMEN: Soft, non-tender, non-distended MUSCULOSKELETAL:  No edema; No deformity  SKIN: Warm and dry NEUROLOGIC:  Alert and oriented x 3 PSYCHIATRIC:  Normal affect    Risk Assessment/Risk Calculators:                 ASSESSMENT & PLAN:    Postpartum hypertension-her blood pressure was manually taken by me 118/80 mmHg.  We will keep her on her current medication dose.  Have asked the patient to take her blood pressure daily and send me the information should she see any blood pressure greater than 140/90 mmHg.  She still continues to breast-feed.  She tells me that around her period she had some elevated blood pressure.  I have asked the patient if this happens at her next.  To take a midday dose of her labetalol.  Patient Instructions  Medication Instructions:  Your physician recommends that you continue on your current medications as directed. Please refer to the Current Medication list given to you today.   Please take your blood pressure daily and record. Please include heart rates. If your blood pressure is greater then 140/90 for two days in a row please contact my office.   HOW TO TAKE YOUR BLOOD PRESSURE: Rest 5 minutes before taking your blood pressure. Don't smoke or drink caffeinated beverages for at least 30 minutes before. Take your blood pressure before (not after) you eat. Sit comfortably with your back supported and both feet on the floor (don't cross your legs). Elevate your arm to heart level on a table or a desk. Use the proper sized cuff. It should fit smoothly and snugly around your bare upper arm. There should be enough room to slip a fingertip  under the cuff. The bottom edge of the cuff should be 1 inch above the crease of the elbow. Ideally, take 3 measurements at one sitting and record the average.   Lab Work: None If you have labs (blood work) drawn today and your tests are completely normal, you will receive your results only by: MyChart Message (if you have MyChart) OR A paper copy in the mail If you have any lab test that is abnormal or we need to change your treatment, we will call you to review the results.   Testing/Procedures: None   Follow-Up: At [facility], you and your health needs are our priority.  As part of our continuing mission to provide you with exceptional heart care, we have created designated Provider Care Teams.  These Care Teams include your primary Cardiologist (physician) and Advanced Practice Providers (APPs -  Physician Assistants and Nurse Practitioners) who all work together to provide you with the care you need, when you need it.  We recommend signing up for the patient portal called "MyChart".  Sign up information is provided on this After Visit Summary.  MyChart is used to connect with patients for Virtual Visits (Telemedicine).  Patients are able to view lab/test results, encounter notes, upcoming appointments, etc.  Non-urgent messages can be sent to your provider as well.   To learn more about what you can do with MyChart, go to [email or web address].    Your next appointment:   4 month(s)  The format for your next appointment:   Virtual Visit   Provider:   Thomasene Ripple, DO   Other Instructions   Important Information About Sugar         Dispo:  Return in about 4 months (around 10/07/2022).   Medication Adjustments/Labs and Tests Ordered: Current medicines are reviewed at length with the patient today.  Concerns regarding medicines are outlined above.  Tests Ordered: Orders Placed This Encounter  Procedures   EKG 12-Lead   Medication Changes: No orders of the  defined types were placed in this encounter.

## 2022-06-15 ENCOUNTER — Encounter: Payer: Self-pay | Admitting: Cardiology

## 2022-08-09 ENCOUNTER — Encounter: Payer: Self-pay | Admitting: Cardiology

## 2022-08-09 MED ORDER — LABETALOL HCL 100 MG PO TABS: ORAL_TABLET | ORAL | 1 refills | Status: DC

## 2022-08-21 DIAGNOSIS — Z01419 Encounter for gynecological examination (general) (routine) without abnormal findings: Secondary | ICD-10-CM | POA: Diagnosis not present

## 2022-08-21 DIAGNOSIS — I10 Essential (primary) hypertension: Secondary | ICD-10-CM | POA: Diagnosis not present

## 2022-08-21 DIAGNOSIS — Z8041 Family history of malignant neoplasm of ovary: Secondary | ICD-10-CM | POA: Diagnosis not present

## 2022-09-03 ENCOUNTER — Encounter: Payer: Self-pay | Admitting: *Deleted

## 2022-09-03 ENCOUNTER — Telehealth: Payer: Self-pay | Admitting: *Deleted

## 2022-09-03 NOTE — Patient Outreach (Signed)
  Care Coordination   Initial Visit Note   09/03/2022 Name: Charlotte Giles MRN: [id number] DOB: [birth year]  Charlotte Giles is a 36 y.o. year old female who sees Nafziger, Kandee Keen, NP for primary care. I spoke with  [name] by phone today.  What matters to the patients health and wellness today?  No needs    Goals Addressed               This Visit's Progress     COMPLETED: No needs (pt-stated)        Care Coordination Interventions: Reviewed medications with patient and discussed adherence with no needed refills Reviewed scheduled/upcoming provider appointments including pending appointments with sufficient transportation source Screening for signs and symptoms of depression related to chronic disease state  Assessed social determinant of health barriers         SDOH assessments and interventions completed:  Yes  SDOH Interventions Today    Flowsheet Row Most Recent Value  SDOH Interventions   Food Insecurity Interventions Intervention Not Indicated  Housing Interventions Intervention Not Indicated  Transportation Interventions Intervention Not Indicated  Utilities Interventions Intervention Not Indicated        Care Coordination Interventions Activated:  Yes  Care Coordination Interventions:  Yes, provided   Follow up plan: No further intervention required.   Encounter Outcome:  Pt. Visit Completed   Elliot Cousin, RN Care Management Coordinator [facility] [employer] Main Office [phone]

## 2022-09-03 NOTE — Patient Instructions (Signed)
Visit Information  Thank you for taking time to visit with me today. Please don't hesitate to contact me if I can be of assistance to you.   Following are the goals we discussed today:   Goals Addressed               This Visit's Progress     COMPLETED: No needs (pt-stated)        Care Coordination Interventions: Reviewed medications with patient and discussed adherence with no needed refills Reviewed scheduled/upcoming provider appointments including pending appointments with sufficient transportation source Screening for signs and symptoms of depression related to chronic disease state  Assessed social determinant of health barriers         Please call the care guide team at [phone] if you need to cancel or reschedule your appointment.   If you are experiencing a Mental Health or Behavioral Health Crisis or need someone to talk to, please call the [place] National Suicide Prevention Lifeline: (785)015-9690 or TTY: 240-274-8218 TTY 403-790-3993) to talk to a trained counselor  Patient verbalizes understanding of instructions and care plan provided today and agrees to view in MyChart. Active MyChart status and patient understanding of how to access instructions and care plan via MyChart confirmed with patient.     No further follow up required: No needs  Elliot Cousin, RN Care Management Coordinator [facility] [facility] Main Office [phone]

## 2022-09-05 DIAGNOSIS — Z0000 Encounter for general adult medical examination without abnormal findings: Secondary | ICD-10-CM | POA: Diagnosis not present

## 2022-10-09 ENCOUNTER — Encounter: Payer: Self-pay | Admitting: Cardiology

## 2022-10-09 ENCOUNTER — Ambulatory Visit: Payer: BC Managed Care – PPO | Attending: Cardiology | Admitting: Cardiology

## 2022-10-09 VITALS — BP 123/76 | HR 68 | Ht 61.0 in | Wt 126.0 lb

## 2022-10-09 DIAGNOSIS — O165 Unspecified maternal hypertension, complicating the puerperium: Secondary | ICD-10-CM | POA: Diagnosis not present

## 2022-10-09 DIAGNOSIS — Z3A00 Weeks of gestation of pregnancy not specified: Secondary | ICD-10-CM

## 2022-10-09 NOTE — Progress Notes (Addendum)
Cardio-Obstetrics Clinic  Follow Up Note   Date:  10/11/2022   ID:  Charlotte Giles, DOB 03-27-1986, MRN [id number]  PCP:  Shirline Frees, NP   [facility] Providers Cardiologist:  Thomasene Ripple, DO  Electrophysiologist:  None        Referring MD: Shirline Frees, NP   Chief Complaint:   History of Present Illness:    Charlotte Giles is a 36 y.o. female [G3P3003] who returns for follow up of postpartum hypertension here today for follow-up visit.  Other medical history includes iron deficiency anemia.    No complaints today.   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   Prenatal Vit-Fe Fumarate-FA (PRENATAL MULTIVITAMIN) TABS tablet Take 1 tablet by mouth daily at 12 noon.     Allergies:   Patient has no known allergies.   Social History   Socioeconomic History   Marital status: Married    Spouse name: Not on file   Number of children: Not on file   Years of education: Not on file   Highest education level: Not on file  Occupational History   Occupation: nurse  Tobacco Use   Smoking status: Never   Smokeless tobacco: Never  Vaping Use   Vaping Use: Never used  Substance and Sexual  Activity   Alcohol use: Yes    Alcohol/week: 0.0 standard drinks of alcohol    Comment: social    Drug use: No   Sexual activity: Yes    Partners: Male    Birth control/protection: Condom    Comment: 1st intercourse- 18, partners- 2, married- 8 yrs   Other Topics Concern   Not on file  Social History Narrative   Nurse in Medical ICU - RN    Two children - 4 and 2    Married - 6 years       Social Determinants of Corporate investment banker Strain: Not on file  Food Insecurity: No Food Insecurity (09/03/2022)   Hunger Vital Sign    Worried About Running Out of Food in the Last Year: Never true    Ran Out of Food in the Last Year: Never true  Transportation Needs: No Transportation Needs (09/03/2022)   PRAPARE - Administrator, Civil Service (Medical): No    Lack of Transportation (Non-Medical): No  Physical Activity: Not on file  Stress:  Not on file  Social Connections: Not on file      Family History  Problem Relation Age of Onset   Hypertension Mother    Ovarian cancer Mother    Cancer Father 29       PROSTATE   Diabetes Maternal Grandmother    Hypertension Maternal Grandmother    Sudden Cardiac Death Maternal Uncle 69       MI    Breast cancer Neg Hx       ROS:   Please see the history of present illness.     All other systems reviewed and are negative.   Labs/EKG Reviewed:    EKG:   EKG is was not ordered today.  Recent Labs: 02/26/2022: Hemoglobin 9.5; Platelets 156   Recent Lipid Panel Lab Results  Component Value Date/Time   CHOL 145 01/29/2020 08:45 AM   TRIG 38.0 01/29/2020 08:45 AM   HDL 46.30 01/29/2020 08:45 AM   CHOLHDL 3 01/29/2020 08:45 AM   LDLCALC 91 01/29/2020 08:45 AM    Physical Exam:    VS:  BP 123/76   Pulse 68   Ht 5\' 1"  (1.549 m)   Wt 126 lb (57.2 kg)   BMI 23.81 kg/m     Wt Readings from Last 3 Encounters:  10/09/22 126 lb (57.2 kg)  06/07/22 132 lb 9.6 oz (60.1 kg)  03/23/22 137 lb 14.4 oz (62.6 kg)      GEN:  Well nourished, well developed in no acute distress HEENT: Normal NECK: No JVD; No carotid bruits LYMPHATICS: No lymphadenopathy CARDIAC: RRR, no murmurs, rubs, gallops RESPIRATORY:  Clear to auscultation without rales, wheezing or rhonchi  ABDOMEN: Soft, non-tender, non-distended MUSCULOSKELETAL:  No edema; No deformity  SKIN: Warm and dry NEUROLOGIC:  Alert and oriented x 3 PSYCHIATRIC:  Normal affect    Risk Assessment/Risk Calculators:                 ASSESSMENT & PLAN:    Postpartum hypertension-blood pressure below target.  But recently she had a cold in has had some slight increased numbers.  She will continue to monitor.  Constipation after 2 weeks of recovering from a cold it is okay to take her labetalol once daily.  At that time she will take her blood pressure every day and send me information in 2 weeks.  The ultimate goal is to titrate her antihypertensive medication. I have advised the patient that we may be successful at times we are not able to and we might end up putting her on chronic antihypertensive medication.  Expresses understanding.  Patient Instructions  Medication Instructions:  Your physician recommends that you continue on your current medications as directed. Please refer to the Current Medication list given to you today.  *If you need a refill on your cardiac medications before your next appointment, please call your pharmacy*  Please take your blood pressure daily for 2 weeks and send in a MyChart message. Please include heart rates.   HOW TO TAKE YOUR BLOOD PRESSURE: Rest 5 minutes before taking your blood pressure. Don't smoke or drink caffeinated beverages for at least 30 minutes before. Take your blood pressure before (not after) you eat. Sit comfortably with your back supported and both feet on the floor (don't cross your legs). Elevate your arm to heart level on a table or a desk. Use the proper sized cuff. It should fit  smoothly and snugly around your bare upper arm. There should be enough room to  slip a fingertip under the cuff. The bottom edge of the cuff should be 1 inch above the crease of the elbow. Ideally, take 3 measurements at one sitting and record the average.   Lab Work: NONE If you have labs (blood work) drawn today and your tests are completely normal, you will receive your results only by: MyChart Message (if you have MyChart) OR A paper copy in the mail If you have any lab test that is abnormal or we need to change your treatment, we will call you to review the results.   Testing/Procedures: NONE   Follow-Up: At [facility], you and your health needs are our priority.  As part of our continuing mission to provide you with exceptional heart care, we have created designated Provider Care Teams.  These Care Teams include your primary Cardiologist (physician) and Advanced Practice Providers (APPs -  Physician Assistants and Nurse Practitioners) who all work together to provide you with the care you need, when you need it.  We recommend signing up for the patient portal called "MyChart".  Sign up information is provided on this After Visit Summary.  MyChart is used to connect with patients for Virtual Visits (Telemedicine).  Patients are able to view lab/test results, encounter notes, upcoming appointments, etc.  Non-urgent messages can be sent to your provider as well.   To learn more about what you can do with MyChart, go to [email or web address].    Your next appointment:   4 month(s)  The format for your next appointment:   In Person  Provider:   Thomasene Ripple, DO     Dispo:  Return in about 4 months (around 02/08/2023).   Medication Adjustments/Labs and Tests Ordered: Current medicines are reviewed at length with the patient today.  Concerns regarding medicines are outlined above.  Tests Ordered: No orders of the defined types were placed in this encounter.  Medication  Changes: No orders of the defined types were placed in this encounter.

## 2022-10-09 NOTE — Patient Instructions (Signed)
Medication Instructions:  Your physician recommends that you continue on your current medications as directed. Please refer to the Current Medication list given to you today.  *If you need a refill on your cardiac medications before your next appointment, please call your pharmacy*  Please take your blood pressure daily for 2 weeks and send in a MyChart message. Please include heart rates.   HOW TO TAKE YOUR BLOOD PRESSURE: Rest 5 minutes before taking your blood pressure. Don't smoke or drink caffeinated beverages for at least 30 minutes before. Take your blood pressure before (not after) you eat. Sit comfortably with your back supported and both feet on the floor (don't cross your legs). Elevate your arm to heart level on a table or a desk. Use the proper sized cuff. It should fit smoothly and snugly around your bare upper arm. There should be enough room to slip a fingertip under the cuff. The bottom edge of the cuff should be 1 inch above the crease of the elbow. Ideally, take 3 measurements at one sitting and record the average.    Lab Work: NONE If you have labs (blood work) drawn today and your tests are completely normal, you will receive your results only by: MyChart Message (if you have MyChart) OR A paper copy in the mail If you have any lab test that is abnormal or we need to change your treatment, we will call you to review the results.   Testing/Procedures: NONE   Follow-Up: At [facility], you and your health needs are our priority.  As part of our continuing mission to provide you with exceptional heart care, we have created designated Provider Care Teams.  These Care Teams include your primary Cardiologist (physician) and Advanced Practice Providers (APPs -  Physician Assistants and Nurse Practitioners) who all work together to provide you with the care you need, when you need it.  We recommend signing up for the patient portal called "MyChart".  Sign up  information is provided on this After Visit Summary.  MyChart is used to connect with patients for Virtual Visits (Telemedicine).  Patients are able to view lab/test results, encounter notes, upcoming appointments, etc.  Non-urgent messages can be sent to your provider as well.   To learn more about what you can do with MyChart, go to [email or web address].    Your next appointment:   4 month(s)  The format for your next appointment:   In Person  Provider:   Kardie Tobb, DO    

## 2022-11-02 ENCOUNTER — Encounter: Payer: Self-pay | Admitting: Cardiology

## 2023-02-26 ENCOUNTER — Ambulatory Visit: Payer: BC Managed Care – PPO | Admitting: Cardiology

## 2023-03-13 ENCOUNTER — Encounter: Payer: Self-pay | Admitting: Adult Health

## 2023-03-13 ENCOUNTER — Ambulatory Visit (INDEPENDENT_AMBULATORY_CARE_PROVIDER_SITE_OTHER): Payer: No Typology Code available for payment source | Admitting: Adult Health

## 2023-03-13 VITALS — BP 120/82 | HR 61 | Temp 98.4°F | Ht 61.5 in | Wt 126.0 lb

## 2023-03-13 DIAGNOSIS — Z1159 Encounter for screening for other viral diseases: Secondary | ICD-10-CM

## 2023-03-13 DIAGNOSIS — Z0000 Encounter for general adult medical examination without abnormal findings: Secondary | ICD-10-CM | POA: Diagnosis not present

## 2023-03-13 DIAGNOSIS — R1032 Left lower quadrant pain: Secondary | ICD-10-CM | POA: Diagnosis not present

## 2023-03-13 LAB — COMPREHENSIVE METABOLIC PANEL
ALT: 14 U/L (ref 0–35)
AST: 17 U/L (ref 0–37)
Albumin: 4.5 g/dL (ref 3.5–5.2)
Alkaline Phosphatase: 51 U/L (ref 39–117)
BUN: 11 mg/dL (ref 6–23)
CO2: 27 mEq/L (ref 19–32)
Calcium: 9.4 mg/dL (ref 8.4–10.5)
Chloride: 103 mEq/L (ref 96–112)
Creatinine, Ser: 0.85 mg/dL (ref 0.40–1.20)
GFR: 88.27 mL/min (ref >60.00)
Glucose, Bld: 81 mg/dL (ref 70–99)
Potassium: 3.7 mEq/L (ref 3.5–5.1)
Sodium: 137 mEq/L (ref 135–145)
Total Bilirubin: 1.1 mg/dL (ref 0.2–1.2)
Total Protein: 7.5 g/dL (ref 6.0–8.3)

## 2023-03-13 LAB — LIPID PANEL
Cholesterol: 155 mg/dL (ref 0–200)
HDL: 58.7 mg/dL (ref >39.00)
LDL Cholesterol: 85 mg/dL (ref 0–99)
NonHDL: 96.23
Total CHOL/HDL Ratio: 3
Triglycerides: 54 mg/dL (ref 0.0–149.0)
VLDL: 10.8 mg/dL (ref 0.0–40.0)

## 2023-03-13 LAB — CBC WITH DIFFERENTIAL/PLATELET
Basophils Absolute: 0 10*3/uL (ref 0.0–0.1)
Basophils Relative: 0.3 % (ref 0.0–3.0)
Eosinophils Absolute: 0.1 10*3/uL (ref 0.0–0.7)
Eosinophils Relative: 0.8 % (ref 0.0–5.0)
HCT: 40.1 % (ref 36.0–46.0)
Hemoglobin: 12.9 g/dL (ref 12.0–15.0)
Lymphocytes Relative: 34 % (ref 12.0–46.0)
Lymphs Abs: 2.4 10*3/uL (ref 0.7–4.0)
MCHC: 32.3 g/dL (ref 30.0–36.0)
MCV: 89.6 fl (ref 78.0–100.0)
Monocytes Absolute: 0.5 10*3/uL (ref 0.1–1.0)
Monocytes Relative: 7.7 % (ref 3.0–12.0)
Neutro Abs: 4 10*3/uL (ref 1.4–7.7)
Neutrophils Relative %: 57.2 % (ref 43.0–77.0)
Platelets: 255 10*3/uL (ref 150.0–400.0)
RBC: 4.47 Mil/uL (ref 3.87–5.11)
RDW: 12.9 % (ref 11.5–15.5)
WBC: 6.9 10*3/uL (ref 4.0–10.5)

## 2023-03-13 LAB — TSH: TSH: 1.31 u[IU]/mL (ref 0.35–5.50)

## 2023-03-13 NOTE — Progress Notes (Signed)
Subjective:    Patient ID: Charlotte Giles, female    DOB: 1986-03-02, 37 y.o.   MRN: [id number]  HPI Patient presents for yearly preventative medicine examination. 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  She had a new baby girl about a year ago.   Left lower quadrant pain - ongoing issue for multiple years. Has had workup including blood work, KUB, CT abdomen and transvaginal US  without reveling results. She continues to have intermittent LLQ pain.    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 She stopped medication 5 months ago. Checks BP at home with normal results.    All immunizations and health maintenance protocols were reviewed with the patient and needed orders were placed.  Appropriate screening laboratory values were ordered for the patient including screening of hyperlipidemia, renal function and hepatic function.  Medication reconciliation,  past medical history, social history, problem list and allergies were reviewed in detail with the patient  Goals were established with regard to weight loss, exercise, and  diet in compliance with medications Wt Readings from Last 3 Encounters:  03/13/23 126 lb (57.2 kg)  10/09/22 126 lb (57.2 kg)  06/07/22 132 lb 9.6 oz (60.1 kg)   She is up to date on GYN care.   She has no acute issue   Review of Systems  Constitutional: Negative.   HENT: Negative.    Eyes: Negative.   Respiratory: Negative.    Cardiovascular: Negative.   Gastrointestinal: Negative.   Endocrine: Negative.   Genitourinary: Negative.   Musculoskeletal: Negative.   Skin: Negative.   Allergic/Immunologic: Negative.   Neurological: Negative.   Hematological: Negative.   Psychiatric/Behavioral: Negative.     Past Medical History:   Diagnosis Date   Acute blood loss anemia 12/12/2013   Anemia    Cervical dysplasia, mild 12/18/2010   High risk HPV infection 11/2010   HSV infection     Social History   Socioeconomic History   Marital status: Married    Spouse name: Not on file   Number of children: Not on file   Years of education: Not on file   Highest education level: Not on file  Occupational History   Occupation: nurse  Tobacco Use   Smoking status: Never   Smokeless tobacco: Never  Vaping Use   Vaping Use: Never used  Substance and Sexual Activity   Alcohol use: Yes    Alcohol/week: 0.0 standard drinks of alcohol    Comment: social    Drug use: No   Sexual activity: Yes    Partners: Male    Birth control/protection: Condom    Comment: 1st intercourse- 18, partners- 2, married- 8 yrs   Other Topics Concern   Not on file  Social History Narrative   Nurse in Medical ICU - RN    Two children - 4 and 2    Married - 6 years       Social Determinants of Corporate investment banker Strain: Not on file  Food Insecurity: No Food Insecurity (09/03/2022)   Hunger Vital Sign    Worried About Running Out of Food in the Last Year: Never true    Ran Out of Food in the Last Year: Never true  Transportation Needs: No Transportation Needs (09/03/2022)  PRAPARE - Administrator, Civil Service (Medical): No    Lack of Transportation (Non-Medical): No  Physical Activity: Not on file  Stress: Not on file  Social Connections: Not on file  Intimate Partner Violence: Not on file    Past Surgical History:  Procedure Laterality Date   HERNIA REPAIR     LAPAROSCOPIC APPENDECTOMY N/A 02/25/2018   Procedure: APPENDECTOMY LAPAROSCOPIC;  Surgeon: Manus Rudd, MD;  Location: [facility] OR;  Service: General;  Laterality: N/A;    Family History  Problem Relation Age of Onset   Hypertension Mother    Ovarian cancer Mother    Cancer Father 31       PROSTATE   Sudden Cardiac Death Maternal Uncle 04/12/36        MI    Diabetes Maternal Grandmother    Hypertension Maternal Grandmother    Breast cancer Neg Hx     No Known Allergies  No current outpatient medications on file prior to visit.   No current facility-administered medications on file prior to visit.    BP 120/82   Pulse 61   Temp 98.4 F (36.9 C) (Oral)   Ht 5' 1.5" (1.562 m)   Wt 126 lb (57.2 kg)   SpO2 100%   BMI 23.42 kg/m       Objective:   Physical Exam Vitals and nursing note reviewed.  Constitutional:      General: She is not in acute distress.    Appearance: Normal appearance. She is not ill-appearing.  HENT:     Head: Normocephalic and atraumatic.     Right Ear: Tympanic membrane, ear canal and external ear normal. There is no impacted cerumen.     Left Ear: Tympanic membrane, ear canal and external ear normal. There is no impacted cerumen.     Nose: Nose normal. No congestion or rhinorrhea.     Mouth/Throat:     Mouth: Mucous membranes are moist.     Pharynx: Oropharynx is clear.  Eyes:     Extraocular Movements: Extraocular movements intact.     Conjunctiva/sclera: Conjunctivae normal.     Pupils: Pupils are equal, round, and reactive to light.  Neck:     Vascular: No carotid bruit.  Cardiovascular:     Rate and Rhythm: Normal rate and regular rhythm.     Pulses: Normal pulses.     Heart sounds: No murmur heard.    No friction rub. No gallop.  Pulmonary:     Effort: Pulmonary effort is normal.     Breath sounds: Normal breath sounds.  Abdominal:     General: Abdomen is flat. Bowel sounds are normal. There is no distension.     Palpations: Abdomen is soft. There is no mass.     Tenderness: There is no abdominal tenderness. There is no guarding or rebound.     Hernia: No hernia is present.  Musculoskeletal:        General: Normal range of motion.     Cervical back: Normal range of motion and neck supple.  Lymphadenopathy:     Cervical: No cervical adenopathy.  Skin:    General: Skin is warm  and dry.     Capillary Refill: Capillary refill takes less than 2 seconds.  Neurological:     General: No focal deficit present.     Mental Status: She is alert and oriented to person, place, and time.  Psychiatric:        Mood and Affect: Mood normal.  Behavior: Behavior normal.        Thought Content: Thought content normal.        Judgment: Judgment normal.       Assessment & Plan:   1. Routine general medical examination at a health care facility - healthy 37 year old female  - CBC with Differential/Platelet; Future - Comprehensive metabolic panel; Future - Lipid panel; Future - TSH; Future - TSH - Lipid panel - Comprehensive metabolic panel - CBC with Differential/Platelet  2. Need for hepatitis C screening test  - Hep C Antibody; Future - Hep C Antibody  3. LLQ pain - Discussed referral to GI, she would like to hold off at this time   Shirline Frees, NP

## 2023-03-14 LAB — HEPATITIS C ANTIBODY: Hepatitis C Ab: NONREACTIVE

## 2023-05-30 ENCOUNTER — Ambulatory Visit (INDEPENDENT_AMBULATORY_CARE_PROVIDER_SITE_OTHER): Payer: No Typology Code available for payment source

## 2023-05-30 ENCOUNTER — Encounter: Payer: Self-pay | Admitting: Cardiology

## 2023-05-30 ENCOUNTER — Ambulatory Visit: Payer: No Typology Code available for payment source | Attending: Internal Medicine | Admitting: Cardiology

## 2023-05-30 VITALS — BP 128/68 | HR 61 | Ht 61.0 in | Wt 124.8 lb

## 2023-05-30 DIAGNOSIS — R002 Palpitations: Secondary | ICD-10-CM

## 2023-05-30 DIAGNOSIS — O165 Unspecified maternal hypertension, complicating the puerperium: Secondary | ICD-10-CM

## 2023-05-30 DIAGNOSIS — Z8759 Personal history of other complications of pregnancy, childbirth and the puerperium: Secondary | ICD-10-CM | POA: Diagnosis not present

## 2023-05-30 NOTE — Progress Notes (Unsigned)
Enrolled for [employer] to mail a ZIO XT long term holter monitor to the patients address on file.  

## 2023-05-30 NOTE — Progress Notes (Signed)
[facility]  New Evaluation  Date:  05/30/2023   ID:  Charlotte Giles, Charlotte Giles April 23, 1986, MRN [id number]  PCP:  Shirline Frees, NP    HeartCare Providers Cardiologist:  Thomasene Ripple, DO  Electrophysiologist:  None       Referring MD: Shirline Frees, NP   Chief Complaint: " I am doing well"  History of Present Illness:    Charlotte Giles is a 37 y.o. female [G3P3003] who is being seen today for the evaluation of postpartum hypertension at the request of Shirline Frees, NP.   Medical postpartum hypertension, iron deficiency anemia here today for follow-up visit.  She tells me that she has been experiencing intermittent palpitations.  Prior CV Studies Reviewed: The following studies were reviewed today: Echocardiogram done in 2014  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   HSV infection     Past Surgical History:  Procedure Laterality Date   HERNIA REPAIR     LAPAROSCOPIC APPENDECTOMY N/A 02/25/2018   Procedure: APPENDECTOMY LAPAROSCOPIC;  Surgeon: Manus Rudd, MD;  Location: [facility] OR;  Service: General;  Laterality: N/A;      OB History     Gravida  3   Para  3   Term  3   Preterm      AB  0   Living  3      SAB      IAB      Ectopic  0   Multiple  0   Live Births  3               Current Medications: No outpatient medications have been marked as taking for the 05/30/23 encounter (Office Visit) with Thomasene Ripple, DO.     Allergies:   Patient has no known allergies.   Social History   Socioeconomic History   Marital status: Married    Spouse name: Not on file   Number of children: Not on file   Years of education: Not on file   Highest education level: Not on file  Occupational History   Occupation: nurse  Tobacco Use   Smoking status: Never   Smokeless tobacco: Never  Vaping Use    Vaping status: Never Used  Substance and Sexual Activity   Alcohol use: Yes    Alcohol/week: 0.0 standard drinks of alcohol    Comment: social    Drug use: No   Sexual activity: Yes    Partners: Male    Birth control/protection: Condom    Comment: 1st intercourse- 18, partners- 2, married- 8 yrs   Other Topics Concern   Not on file  Social History Narrative   Nurse in Medical ICU - RN    Two children - 4 and 2    Married - 6 years       Social Determinants of Corporate investment banker Strain: Not on file  Food Insecurity: No Food Insecurity (09/03/2022)   Hunger Vital Sign    Worried About Running Out of Food in the Last Year: Never true    Ran Out of Food in the Last Year: Never true  Transportation Needs: No Transportation Needs (09/03/2022)   PRAPARE - Administrator, Civil Service (Medical): No    Lack of Transportation (Non-Medical): No  Physical Activity: Not on file  Stress: Not on file  Social  Connections: Not on file      Family History  Problem Relation Age of Onset   Hypertension Mother    Ovarian cancer Mother    Cancer Father 60       PROSTATE   Sudden Cardiac Death Maternal Uncle 67       MI    Diabetes Maternal Grandmother    Hypertension Maternal Grandmother    Breast cancer Neg Hx       ROS:   Please see the history of present illness.    Palpitations All other systems reviewed and are negative.   Labs/EKG Reviewed:    EKG:   EKG was ordered today.  The ekg ordered today demonstrates sinus rhythm  Recent Labs: 03/13/2023: ALT 14; BUN 11; Creatinine, Ser 0.85; Hemoglobin 12.9; Platelets 255.0; Potassium 3.7; Sodium 137; TSH 1.31   Recent Lipid Panel Lab Results  Component Value Date/Time   CHOL 155 03/13/2023 09:10 AM   TRIG 54.0 03/13/2023 09:10 AM   HDL 58.70 03/13/2023 09:10 AM   CHOLHDL 3 03/13/2023 09:10 AM   LDLCALC 85 03/13/2023 09:10 AM    Physical Exam:    VS:  BP 128/68   Pulse 61   Ht 5\' 1"  (1.549 m)    Wt 124 lb 12.8 oz (56.6 kg)   SpO2 100%   BMI 23.58 kg/m     Wt Readings from Last 3 Encounters:  05/30/23 124 lb 12.8 oz (56.6 kg)  03/13/23 126 lb (57.2 kg)  10/09/22 126 lb (57.2 kg)     GEN:  Well nourished, well developed in no acute distress HEENT: Normal NECK: No JVD; No carotid bruits LYMPHATICS: No lymphadenopathy CARDIAC: RRR, no murmurs, rubs, gallops RESPIRATORY:  Clear to auscultation without rales, wheezing or rhonchi  ABDOMEN: Soft, non-tender, non-distended MUSCULOSKELETAL:  No edema; No deformity  SKIN: Warm and dry NEUROLOGIC:  Alert and oriented x 3 PSYCHIATRIC:  Normal affect    Risk Assessment/Risk Calculators:                 ASSESSMENT & PLAN:    Palpitation Postpartum hypertension  With the palpitation I will place a monitor on the patient to rule out any arrhythmias. Blood pressure is normal in the office today.   Patient Instructions  Medication Instructions:  Your physician recommends that you continue on your current medications as directed. Please refer to the Current Medication list given to you today.  *If you need a refill on your cardiac medications before your next appointment, please call your pharmacy*   Lab Work: None   Testing/Procedures: [name]- Long Term Monitor Instructions  Your physician has requested you wear a ZIO patch monitor for 14 days.  This is a single patch monitor. [employer] supplies one patch monitor per enrollment. Additional stickers are not available. Please do not apply patch if you will be having a Nuclear Stress Test,  Echocardiogram, Cardiac CT, MRI, or Chest Xray during the period you would be wearing the  monitor. The patch cannot be worn during these tests. You cannot remove and re-apply the  ZIO XT patch monitor.  Your ZIO patch monitor will be mailed 3 day USPS to your address on file. It may take 3-5 days  to receive your monitor after you have been enrolled.  Once you have received your  monitor, please review the enclosed instructions. Your monitor  has already been registered assigning a specific monitor serial # to you.  Billing and Patient Assistance Program Information  We have supplied [employer] with any of your insurance information on file for billing purposes. [employer] offers a sliding scale Patient Assistance Program for patients that do not have  insurance, or whose insurance does not completely cover the cost of the ZIO monitor.  You must apply for the Patient Assistance Program to qualify for this discounted rate.  To apply, please call [employer] at [phone], select option 4, select option 2, ask to apply for  Patient Assistance Program. Meredeth Ide will ask your household income, and how many people  are in your household. They will quote your out-of-pocket cost based on that information.  [employer] will also be able to set up a 65-month, interest-free payment plan if needed.  Applying the monitor   Shave hair from upper left chest.  Hold abrader disc by orange tab. Rub abrader in 40 strokes over the upper left chest as  indicated in your monitor instructions.  Clean area with 4 enclosed alcohol pads. Let dry.  Apply patch as indicated in monitor instructions. Patch will be placed under collarbone on left  side of chest with arrow pointing upward.  Rub patch adhesive wings for 2 minutes. Remove white label marked "1". Remove the white  label marked "2". Rub patch adhesive wings for 2 additional minutes.  While looking in a mirror, press and release button in center of patch. A small green light will  flash 3-4 times. This will be your only indicator that the monitor has been turned on.  Do not shower for the first 24 hours. You may shower after the first 24 hours.  Press the button if you feel a symptom. You will hear a small click. Record Date, Time and  Symptom in the Patient Logbook.  When you are ready to remove the patch, follow instructions on the last 2  pages of Patient  Logbook. Stick patch monitor onto the last page of Patient Logbook.  Place Patient Logbook in the blue and white box. Use locking tab on box and tape box closed  securely. The blue and white box has prepaid postage on it. Please place it in the mailbox as  soon as possible. Your physician should have your test results approximately 7 days after the  monitor has been mailed back to [facility].  Call [facility] Customer Care at [phone] if you have questions regarding  your ZIO XT patch monitor. Call them immediately if you see an orange light blinking on your  monitor.  If your monitor falls off in less than 4 days, contact our Monitor department at [phone].  If your monitor becomes loose or falls off after 4 days call [employer] at [phone] for  suggestions on securing your monitor    Follow-Up: At [facility], you and your health needs are our priority.  As part of our continuing mission to provide you with exceptional heart care, we have created designated Provider Care Teams.  These Care Teams include your primary Cardiologist (physician) and Advanced Practice Providers (APPs -  Physician Assistants and Nurse Practitioners) who all work together to provide you with the care you need, when you need it.  Your next appointment:   1 year(s)  Provider:   Thomasene Ripple, DO    Dispo:  No follow-ups on file.   Medication Adjustments/Labs and Tests Ordered: Current medicines are reviewed at length with the patient today.  Concerns regarding medicines are outlined above.  Tests Ordered: Orders Placed This Encounter  Procedures   LONG TERM MONITOR (  3-14 DAYS)   EKG 12-Lead   Medication Changes: No orders of the defined types were placed in this encounter.

## 2023-05-30 NOTE — Patient Instructions (Signed)
Medication Instructions:  Your physician recommends that you continue on your current medications as directed. Please refer to the Current Medication list given to you today.  *If you need a refill on your cardiac medications before your next appointment, please call your pharmacy*   Lab Work: None   Testing/Procedures: [place] Monitor Instructions  Your physician has requested you wear a ZIO patch monitor for 14 days.  This is a single patch monitor. [employer] supplies one patch monitor per enrollment. Additional stickers are not available. Please do not apply patch if you will be having a Nuclear Stress Test,  Echocardiogram, Cardiac CT, MRI, or Chest Xray during the period you would be wearing the  monitor. The patch cannot be worn during these tests. You cannot remove and re-apply the  ZIO XT patch monitor.  Your ZIO patch monitor will be mailed 3 day USPS to your address on file. It may take 3-5 days  to receive your monitor after you have been enrolled.  Once you have received your monitor, please review the enclosed instructions. Your monitor  has already been registered assigning a specific monitor serial # to you.  Billing and Patient Assistance Program Information  We have supplied [employer] with any of your insurance information on file for billing purposes. [employer] offers a sliding scale Patient Assistance Program for patients that do not have  insurance, or whose insurance does not completely cover the cost of the ZIO monitor.  You must apply for the Patient Assistance Program to qualify for this discounted rate.  To apply, please call [employer] at [phone], select option 4, select option 2, ask to apply for  Patient Assistance Program. Theodore Demark will ask your household income, and how many people  are in your household. They will quote your out-of-pocket cost based on that information.  [employer] will also be able to set up a 59-month interest-free payment plan if  needed.  Applying the monitor   Shave hair from upper left chest.  Hold abrader disc by orange tab. Rub abrader in 40 strokes over the upper left chest as  indicated in your monitor instructions.  Clean area with 4 enclosed alcohol pads. Let dry.  Apply patch as indicated in monitor instructions. Patch will be placed under collarbone on left  side of chest with arrow pointing upward.  Rub patch adhesive wings for 2 minutes. Remove white label marked "1". Remove the white  label marked "2". Rub patch adhesive wings for 2 additional minutes.  While looking in a mirror, press and release button in center of patch. A small green light will  flash 3-4 times. This will be your only indicator that the monitor has been turned on.  Do not shower for the first 24 hours. You may shower after the first 24 hours.  Press the button if you feel a symptom. You will hear a small click. Record Date, Time and  Symptom in the Patient Logbook.  When you are ready to remove the patch, follow instructions on the last 2 pages of Patient  Logbook. Stick patch monitor onto the last page of Patient Logbook.  Place Patient Logbook in the blue and white box. Use locking tab on box and tape box closed  securely. The blue and white box has prepaid postage on it. Please place it in the mailbox as  soon as possible. Your physician should have your test results approximately 7 days after the  monitor has been mailed back to [facility]  Call [facility] Customer Care at [phone] if you have questions regarding  your ZIO XT patch monitor. Call them immediately if you see an orange light blinking on your  monitor.  If your monitor falls off in less than 4 days, contact our Monitor department at [phone].  If your monitor becomes loose or falls off after 4 days call [employer] at [phone] for  suggestions on securing your monitor    Follow-Up: At [facility], you and your health needs are  our priority.  As part of our continuing mission to provide you with exceptional heart care, we have created designated Provider Care Teams.  These Care Teams include your primary Cardiologist (physician) and Advanced Practice Providers (APPs -  Physician Assistants and Nurse Practitioners) who all work together to provide you with the care you need, when you need it.   Your next appointment:   1 year(s)  Provider:   Thomasene Ripple, DO

## 2023-06-02 DIAGNOSIS — R002 Palpitations: Secondary | ICD-10-CM | POA: Diagnosis not present

## 2023-06-07 ENCOUNTER — Ambulatory Visit: Payer: BC Managed Care – PPO | Admitting: Cardiology

## 2023-06-27 ENCOUNTER — Encounter: Payer: Self-pay | Admitting: Cardiology

## 2023-06-28 NOTE — Telephone Encounter (Signed)
Appt made for 9/10.

## 2023-07-02 ENCOUNTER — Ambulatory Visit: Payer: No Typology Code available for payment source | Attending: Cardiology | Admitting: Cardiology

## 2023-07-02 ENCOUNTER — Encounter: Payer: Self-pay | Admitting: Cardiology

## 2023-07-02 VITALS — BP 120/70 | HR 62

## 2023-07-02 DIAGNOSIS — I471 Supraventricular tachycardia, unspecified: Secondary | ICD-10-CM | POA: Diagnosis not present

## 2023-07-02 DIAGNOSIS — O165 Unspecified maternal hypertension, complicating the puerperium: Secondary | ICD-10-CM

## 2023-07-02 NOTE — Patient Instructions (Signed)
  Follow-Up: At [facility], you and your health needs are our priority.  As part of our continuing mission to provide you with exceptional heart care, we have created designated Provider Care Teams.  These Care Teams include your primary Cardiologist (physician) and Advanced Practice Providers (APPs -  Physician Assistants and Nurse Practitioners) who all work together to provide you with the care you need, when you need it.  We recommend signing up for the patient portal called "MyChart".  Sign up information is provided on this After Visit Summary.  MyChart is used to connect with patients for Virtual Visits (Telemedicine).  Patients are able to view lab/test results, encounter notes, upcoming appointments, etc.  Non-urgent messages can be sent to your provider as well.   To learn more about what you can do with MyChart, go to [email or web address].    Your next appointment:   1 year(s)  The format for your next appointment:   In Person or Virtual   Provider:   Thomasene Ripple, DO

## 2023-07-03 NOTE — Progress Notes (Signed)
Cardio-Obstetrics Clinic  New Evaluation  Date:  07/03/2023   ID:  Charlotte Giles, DOB [birth year], MRN [id number]  PCP:  Shirline Frees, NP   [facility] Providers Cardiologist:  Thomasene Ripple, DO  Electrophysiologist:  None       Referring MD: Shirline Frees, NP   Chief Complaint: " I am doing well"  History of Present Illness:    Charlotte Giles is a 37 y.o. female [G3P3003] who is being seen today for the evaluation of postpartum hypertension at the request of Shirline Frees, NP.   Medical postpartum hypertension, iron deficiency anemia here today for follow-up visit.  At her last visit she told me she was experiencing palpitation I placed a monitor on the patient.  Virtual Visit via Video  Note . I connected with the patient today by a   video enabled telemedicine application and verified that I am speaking with the correct person using two identifiers.  She is at home. In the clinic.  She does have the palpitation has improved.  It is not worse.  Prior CV Studies Reviewed: The following studies were reviewed today: Reviewed her echo as well as her ZIO monitor.  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     Allergies:   Patient has no known allergies.    Social History   Socioeconomic History   Marital status: Married    Spouse name: Not on file   Number of children: Not on file   Years of education: Not on file   Highest education level: Not on file  Occupational History   Occupation: nurse  Tobacco Use   Smoking status: Never   Smokeless tobacco: Never  Vaping Use   Vaping status: Never Used  Substance and Sexual Activity   Alcohol use: Yes    Alcohol/week: 0.0 standard drinks of alcohol    Comment: social    Drug use: No   Sexual activity: Yes    Partners: Male    Birth control/protection: Condom    Comment: 1st intercourse- 18, partners- 2, married- 8 yrs   Other Topics Concern   Not on file  Social History Narrative   Nurse in Medical ICU - RN    Two children - 4 and 2    Married - 6 years       Social Determinants of Health   Financial Resource Strain: Not on file  Food Insecurity: No Food Insecurity (09/03/2022)   Hunger Vital Sign  Worried About Programme researcher, broadcasting/film/video in the Last Year: Never true    Ran Out of Food in the Last Year: Never true  Transportation Needs: No Transportation Needs (09/03/2022)   PRAPARE - Administrator, Civil Service (Medical): No    Lack of Transportation (Non-Medical): No  Physical Activity: Not on file  Stress: Not on file  Social Connections: Not on file      Family History  Problem Relation Age of Onset   Hypertension Mother    Ovarian cancer Mother    Cancer Father 56       PROSTATE   Sudden Cardiac Death Maternal Uncle 1       MI    Diabetes Maternal Grandmother    Hypertension Maternal Grandmother    Breast cancer Neg Hx       ROS:   Please see the history of present illness.    Palpitations All other systems reviewed and are negative.   Labs/EKG Reviewed:    EKG:   None today  Recent Labs: 03/13/2023: ALT 14; BUN 11; Creatinine, Ser 0.85; Hemoglobin 12.9; Platelets 255.0; Potassium 3.7; Sodium 137; TSH 1.31   Recent Lipid Panel Lab  Results  Component Value Date/Time   CHOL 155 03/13/2023 09:10 AM   TRIG 54.0 03/13/2023 09:10 AM   HDL 58.70 03/13/2023 09:10 AM   CHOLHDL 3 03/13/2023 09:10 AM   LDLCALC 85 03/13/2023 09:10 AM    Physical Exam:    VS:  BP 120/70 (BP Location: Left Arm, Patient Position: Sitting, Cuff Size: Large)   Pulse 62     Wt Readings from Last 3 Encounters:  05/30/23 124 lb 12.8 oz (56.6 kg)  03/13/23 126 lb (57.2 kg)  10/09/22 126 lb (57.2 kg)     GEN:  Well nourished, well developed in no acute distress HEENT: Normal NECK: No JVD; No carotid bruits LYMPHATICS: No lymphadenopathy CARDIAC: RRR, no murmurs, rubs, gallops RESPIRATORY:  Clear to auscultation without rales, wheezing or rhonchi  ABDOMEN: Soft, non-tender, non-distended MUSCULOSKELETAL:  No edema; No deformity  SKIN: Warm and dry NEUROLOGIC:  Alert and oriented x 3 PSYCHIATRIC:  Normal affect    Risk Assessment/Risk Calculators:                 ASSESSMENT & PLAN:    P\SVT Postpartum hypertension  Her blood is normal in the office.  No need to change her antihypertensive regimen at this time. We talked about her monitor results.  Will not start any medication at this time.  Will continue to monitor the patient.  Patient Instructions   Follow-Up: At [facility], you and your health needs are our priority.  As part of our continuing mission to provide you with exceptional heart care, we have created designated Provider Care Teams.  These Care Teams include your primary Cardiologist (physician) and Advanced Practice Providers (APPs -  Physician Assistants and Nurse Practitioners) who all work together to provide you with the care you need, when you need it.  We recommend signing up for the patient portal called "MyChart".  Sign up information is provided on this After Visit Summary.  MyChart is used to connect with patients for Virtual Visits (Telemedicine).  Patients are able to view lab/test results,  encounter notes, upcoming appointments, etc.  Non-urgent messages can be sent to your provider as well.   To learn more about what you can do with MyChart, go to [email or web address].    Your next appointment:  1 year(s)  The format for your next appointment:   In Person or Virtual   Provider:   Thomasene Ripple, DO     Dispo:  No follow-ups on file.   Medication Adjustments/Labs and Tests Ordered: Current medicines are reviewed at length with the patient today.  Concerns regarding medicines are outlined above.  Tests Ordered: No orders of the defined types were placed in this encounter.  Medication Changes: No orders of the defined types were placed in this encounter.

## 2024-03-19 ENCOUNTER — Ambulatory Visit (INDEPENDENT_AMBULATORY_CARE_PROVIDER_SITE_OTHER): Admitting: Adult Health

## 2024-03-19 ENCOUNTER — Ambulatory Visit: Payer: Self-pay | Admitting: Adult Health

## 2024-03-19 ENCOUNTER — Encounter: Payer: Self-pay | Admitting: Adult Health

## 2024-03-19 VITALS — BP 100/70 | HR 82 | Temp 98.2°F | Ht 61.25 in | Wt 126.0 lb

## 2024-03-19 DIAGNOSIS — K2100 Gastro-esophageal reflux disease with esophagitis, without bleeding: Secondary | ICD-10-CM | POA: Diagnosis not present

## 2024-03-19 DIAGNOSIS — R1032 Left lower quadrant pain: Secondary | ICD-10-CM

## 2024-03-19 DIAGNOSIS — Z0000 Encounter for general adult medical examination without abnormal findings: Secondary | ICD-10-CM

## 2024-03-19 LAB — COMPREHENSIVE METABOLIC PANEL WITH GFR
ALT: 14 U/L (ref 0–35)
AST: 17 U/L (ref 0–37)
Albumin: 4.5 g/dL (ref 3.5–5.2)
Alkaline Phosphatase: 47 U/L (ref 39–117)
BUN: 14 mg/dL (ref 6–23)
CO2: 28 meq/L (ref 19–32)
Calcium: 9.5 mg/dL (ref 8.4–10.5)
Chloride: 103 meq/L (ref 96–112)
Creatinine, Ser: 0.92 mg/dL (ref 0.40–1.20)
GFR: 79.7 mL/min (ref >60.00)
Glucose, Bld: 84 mg/dL (ref 70–99)
Potassium: 3.8 meq/L (ref 3.5–5.1)
Sodium: 137 meq/L (ref 135–145)
Total Bilirubin: 0.7 mg/dL (ref 0.2–1.2)
Total Protein: 7.3 g/dL (ref 6.0–8.3)

## 2024-03-19 LAB — CBC WITH DIFFERENTIAL/PLATELET
Basophils Absolute: 0 10*3/uL (ref 0.0–0.1)
Basophils Relative: 0.3 % (ref 0.0–3.0)
Eosinophils Absolute: 0.1 10*3/uL (ref 0.0–0.7)
Eosinophils Relative: 0.6 % (ref 0.0–5.0)
HCT: 39 % (ref 36.0–46.0)
Hemoglobin: 12.7 g/dL (ref 12.0–15.0)
Lymphocytes Relative: 26.6 % (ref 12.0–46.0)
Lymphs Abs: 2.3 10*3/uL (ref 0.7–4.0)
MCHC: 32.7 g/dL (ref 30.0–36.0)
MCV: 87.8 fl (ref 78.0–100.0)
Monocytes Absolute: 0.8 10*3/uL (ref 0.1–1.0)
Monocytes Relative: 9.4 % (ref 3.0–12.0)
Neutro Abs: 5.3 10*3/uL (ref 1.4–7.7)
Neutrophils Relative %: 63.1 % (ref 43.0–77.0)
Platelets: 261 10*3/uL (ref 150.0–400.0)
RBC: 4.44 Mil/uL (ref 3.87–5.11)
RDW: 13.5 % (ref 11.5–15.5)
WBC: 8.5 10*3/uL (ref 4.0–10.5)

## 2024-03-19 LAB — LIPID PANEL
Cholesterol: 134 mg/dL (ref 0–200)
HDL: 46.5 mg/dL (ref >39.00)
LDL Cholesterol: 78 mg/dL (ref 0–99)
NonHDL: 87.68
Total CHOL/HDL Ratio: 3
Triglycerides: 50 mg/dL (ref 0.0–149.0)
VLDL: 10 mg/dL (ref 0.0–40.0)

## 2024-03-19 LAB — TSH: TSH: 1.16 u[IU]/mL (ref 0.35–5.50)

## 2024-03-19 MED ORDER — PANTOPRAZOLE SODIUM 40 MG PO TBEC: 40.0000 mg | DELAYED_RELEASE_TABLET | Freq: Every day | ORAL | 1 refills | Status: DC

## 2024-03-19 NOTE — Patient Instructions (Signed)
 It was great seeing you today   We will follow up with you regarding your lab work   Please let me know if you need anything

## 2024-03-19 NOTE — Progress Notes (Signed)
 Subjective:    Patient ID: Jadine May, female    DOB: 1986/08/05, 38 y.o.   MRN: [id number]  HPI Patient presents for yearly preventative medicine examination. She is a pleasant 38 year old female who  has a past medical history of Acute blood loss anemia (12/12/2013), Anemia, Cervical dysplasia, mild (12/18/2010), High risk HPV infection (11/2010), and HSV infection.  Left lower quadrant pain - ongoing issue for multiple years. Has had workup including blood work, KUB, CT abdomen and transvaginal US   without reveling results. She continues to have LLQ pain that is pretty consistent. She has also developed nausea and throat irritation. No changes in bowel movements. She did try using OTC acid reflux and found this somewhat helpful but was not taking the medication consistently. She would like to be referred to GI   All immunizations and health maintenance protocols were reviewed with the patient and needed orders were placed.   Appropriate screening laboratory values were ordered for the patient including screening of hyperlipidemia, renal function and hepatic function.  Medication reconciliation,  past medical history, social history, problem list and allergies were reviewed in detail with the patient  Goals were established with regard to weight loss, exercise, and  diet in compliance with medications. She does exercise and eat healthy   Wt Readings from Last 3 Encounters:  03/19/24 126 lb (57.2 kg)  05/30/23 124 lb 12.8 oz (56.6 kg)  03/13/23 126 lb (57.2 kg)   Review of Systems  Constitutional: Negative.   HENT: Negative.    Eyes: Negative.   Respiratory: Negative.    Cardiovascular: Negative.   Gastrointestinal:  Positive for abdominal pain and nausea.  Endocrine: Negative.   Genitourinary: Negative.   Musculoskeletal: Negative.   Skin: Negative.   Allergic/Immunologic: Negative.   Neurological: Negative.   Hematological: Negative.   Psychiatric/Behavioral:  Negative.     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   HSV infection     Social History   Socioeconomic History   Marital status: Married    Spouse name: Not on file   Number of children: Not on file   Years of education: Not on file   Highest education level: Not on file  Occupational History   Occupation: nurse  Tobacco Use   Smoking status: Never   Smokeless tobacco: Never  Vaping Use   Vaping status: Never Used  Substance and Sexual Activity   Alcohol use: Yes    Alcohol/week: 0.0 standard drinks of alcohol    Comment: social    Drug use: No   Sexual activity: Yes    Partners: Male    Birth control/protection: Condom    Comment: 1st intercourse- 18, partners- 2, married- 8 yrs   Other Topics Concern   Not on file  Social History Narrative    RN    Two children - 4 and 2    Married - 6 years       Social Drivers of Corporate investment banker Strain: Not on file  Food Insecurity: No Food Insecurity (09/03/2022)   Hunger Vital Sign    Worried About Running Out of Food in the Last Year: Never true    Ran Out of Food in the Last Year: Never true  Transportation Needs: No Transportation Needs (09/03/2022)   PRAPARE - Transportation    Lack of  Transportation (Medical): No    Lack of Transportation (Non-Medical): No  Physical Activity: Not on file  Stress: Not on file  Social Connections: Not on file  Intimate Partner Violence: Not on file    Past Surgical History:  Procedure Laterality Date   HERNIA REPAIR     LAPAROSCOPIC APPENDECTOMY N/A 02/25/2018   Procedure: APPENDECTOMY LAPAROSCOPIC;  Surgeon: Dareen Ebbing, MD;  Location: [facility] OR;  Service: General;  Laterality: N/A;    Family History  Problem Relation Age of Onset   Hypertension Mother    Ovarian cancer Mother    Cancer Father 54       PROSTATE   Sudden Cardiac Death Maternal  Uncle 02-Apr-2036       MI    Diabetes Maternal Grandmother    Hypertension Maternal Grandmother    Breast cancer Neg Hx     No Known Allergies  No current outpatient medications on file prior to visit.   No current facility-administered medications on file prior to visit.    BP 100/70   Pulse 82   Temp 98.2 F (36.8 C) (Oral)   Ht 5' 1.25" (1.556 m)   Wt 126 lb (57.2 kg)   SpO2 97%   Breastfeeding No   BMI 23.61 kg/m       Objective:   Physical Exam Vitals and nursing note reviewed.  Constitutional:      General: She is not in acute distress.    Appearance: Normal appearance. She is not ill-appearing.  HENT:     Head: Normocephalic and atraumatic.     Right Ear: Tympanic membrane, ear canal and external ear normal. There is no impacted cerumen.     Left Ear: Tympanic membrane, ear canal and external ear normal. There is no impacted cerumen.     Nose: Nose normal. No congestion or rhinorrhea.     Mouth/Throat:     Mouth: Mucous membranes are moist.     Pharynx: Oropharynx is clear.  Eyes:     Extraocular Movements: Extraocular movements intact.     Conjunctiva/sclera: Conjunctivae normal.     Pupils: Pupils are equal, round, and reactive to light.  Neck:     Vascular: No carotid bruit.  Cardiovascular:     Rate and Rhythm: Normal rate and regular rhythm.     Pulses: Normal pulses.     Heart sounds: No murmur heard.    No friction rub. No gallop.  Pulmonary:     Effort: Pulmonary effort is normal.     Breath sounds: Normal breath sounds.  Abdominal:     General: Abdomen is flat. Bowel sounds are normal. There is no distension.     Palpations: Abdomen is soft. There is no mass.     Tenderness: There is abdominal tenderness in the epigastric area and left lower quadrant. There is no guarding or rebound.     Hernia: No hernia is present.  Musculoskeletal:        General: Normal range of motion.     Cervical back: Normal range of motion and neck supple.   Lymphadenopathy:     Cervical: No cervical adenopathy.  Skin:    General: Skin is warm and dry.     Capillary Refill: Capillary refill takes less than 2 seconds.  Neurological:     General: No focal deficit present.     Mental Status: She is alert and oriented to person, place, and time.  Psychiatric:        Mood and  Affect: Mood normal.        Behavior: Behavior normal.        Thought Content: Thought content normal.        Judgment: Judgment normal.        Assessment & Plan:  1. Routine general medical examination at a health care facility (Primary) Today patient counseled on age appropriate routine health concerns for screening and prevention, each reviewed and up to date or declined. Immunizations reviewed and up to date or declined. Labs ordered and reviewed. Risk factors for depression reviewed and negative. Hearing function and visual acuity are intact. ADLs screened and addressed as needed. Functional ability and level of safety reviewed and appropriate. Education, counseling and referrals performed based on assessed risks today. Patient provided with a copy of personalized plan for preventive services.   2. LLQ pain - Will refer to GI  - CBC with Differential/Platelet; Future - Comprehensive metabolic panel with GFR; Future - Lipid panel; Future - TSH; Future - Ambulatory referral to Gastroenterology  3. Gastroesophageal reflux disease with esophagitis without hemorrhage  - pantoprazole (PROTONIX) 40 MG tablet; Take 1 tablet (40 mg total) by mouth daily.  Dispense: 30 tablet; Refill: 1 - Ambulatory referral to Gastroenterology  Alto Atta, NP

## 2024-04-06 ENCOUNTER — Encounter: Payer: Self-pay | Admitting: Gastroenterology

## 2024-05-24 NOTE — Progress Notes (Unsigned)
 Charlotte Giles [id number] 1986-07-13   Chief Complaint:  Referring Provider: Merna Huxley, NP Primary GI MD: Sampson  HPI: Charlotte Giles is a 38 y.o. female with past medical history of acute blood loss anemia 2015, appendectomy 2019, hernia repair who presents today for a complaint of abdominal pain and GERD.    Labs 03/19/2024: Normal CBC, CMP, lipid panel, TSH   Previous GI Procedures/Imaging   CT A/P 11/28/2020 1. No CT findings to explain the patient's left lower quadrant pain. 2. Moderately distended stomach.   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 30 tablet 1   No current facility-administered medications for this visit.    Allergies as of 05/25/2024   (No Known Allergies)    Family History  Problem Relation Age of Onset   Hypertension Mother    Ovarian cancer Mother    Cancer Father 37       PROSTATE   Sudden Cardiac Death Maternal Uncle 34       MI    Diabetes Maternal Grandmother    Hypertension Maternal Grandmother    Breast cancer Neg Hx     Social History   Tobacco Use   Smoking status: Never   Smokeless tobacco: Never  Vaping Use   Vaping status: Never Used  Substance Use Topics   Alcohol use: Yes    Alcohol/week: 0.0 standard drinks of alcohol    Comment: social    Drug use: No     Review of Systems:    Constitutional: No weight loss, fever, chills, weakness or fatigue Eyes: No change in  vision Ears, Nose, Throat:  No change in hearing or congestion Skin: No rash or itching Cardiovascular: No chest pain, chest pressure or palpitations   Respiratory: No SOB or cough Gastrointestinal: See HPI and otherwise negative Genitourinary: No dysuria or change in urinary frequency Neurological: No headache, dizziness or syncope Musculoskeletal: No new muscle or joint pain Hematologic: No bleeding or bruising    Physical Exam:  Vital signs: There were no vitals taken for this visit.  Constitutional: NAD, Well developed, Well nourished, alert and cooperative Head:  Normocephalic and atraumatic.  Eyes: No scleral icterus. Conjunctiva pink. Mouth: No oral lesions. Respiratory: Respirations even and unlabored. Lungs clear to auscultation bilaterally.  No wheezes, crackles, or rhonchi.  Cardiovascular:  Regular rate and rhythm. No murmurs. No peripheral edema. Gastrointestinal:  Soft, nondistended, nontender. No rebound or guarding. Normal bowel sounds. No appreciable masses or hepatomegaly. Rectal:  Not performed.  Neurologic:  Alert and oriented x4;  grossly normal neurologically.  Skin:   Dry and intact without significant lesions or rashes. Psychiatric: Oriented to person, place and time. Demonstrates good judgement and reason without abnormal affect or behaviors.   RELEVANT LABS AND IMAGING: CBC    Component Value Date/Time   WBC 8.5 03/19/2024 0850   RBC 4.44 03/19/2024 0850   HGB 12.7 03/19/2024 0850   HCT  39.0 03/19/2024 0850   PLT 261.0 03/19/2024 0850   MCV 87.8 03/19/2024 0850   MCH 31.7 02/26/2022 0548   MCHC 32.7 03/19/2024 0850   RDW 13.5 03/19/2024 0850   LYMPHSABS 2.3 03/19/2024 0850   MONOABS 0.8 03/19/2024 0850   EOSABS 0.1 03/19/2024 0850   BASOSABS 0.0 03/19/2024 0850    CMP     Component Value Date/Time   NA 137 03/19/2024 0850   K 3.8 03/19/2024 0850   CL 103 03/19/2024 0850   CO2 28 03/19/2024 0850   GLUCOSE 84 03/19/2024 0850   BUN 14  03/19/2024 0850   CREATININE 0.92 03/19/2024 0850   CREATININE 0.97 11/11/2020 1607   CALCIUM 9.5 03/19/2024 0850   PROT 7.3 03/19/2024 0850   ALBUMIN 4.5 03/19/2024 0850   AST 17 03/19/2024 0850   ALT 14 03/19/2024 0850   ALKPHOS 47 03/19/2024 0850   BILITOT 0.7 03/19/2024 0850   GFRNONAA >60 02/27/2018 0600   GFRAA >60 02/27/2018 0600     Assessment/Plan:       Camie Furbish, PA-C [facility] 05/24/2024, 8:44 PM  Patient Care Team: Merna Huxley, NP as PCP - General (Family Medicine) Tobb, Kardie, DO as PCP - Cardiology (Cardiology) Lavoie, Marie-Lyne, MD as Consulting Physician (Gynecology) Rutherford Gain, MD as Consulting Physician (Obstetrics)

## 2024-05-25 ENCOUNTER — Ambulatory Visit: Admitting: Gastroenterology

## 2024-05-25 ENCOUNTER — Encounter: Payer: Self-pay | Admitting: Gastroenterology

## 2024-05-25 VITALS — BP 122/72 | HR 100 | Ht 61.25 in | Wt 127.0 lb

## 2024-05-25 DIAGNOSIS — R1013 Epigastric pain: Secondary | ICD-10-CM | POA: Diagnosis not present

## 2024-05-25 DIAGNOSIS — R1032 Left lower quadrant pain: Secondary | ICD-10-CM | POA: Diagnosis not present

## 2024-05-25 DIAGNOSIS — J392 Other diseases of pharynx: Secondary | ICD-10-CM

## 2024-05-25 NOTE — Patient Instructions (Signed)
 Your provider has requested that you go to the basement level for lab work before leaving today. Press B on the elevator. The lab is located at the first door on the left as you exit the elevator.  Please purchase the following medications over the counter and take as directed:IBgard to take as directed per package. Samples have been given in the office.   You have been scheduled for a CT scan of the abdomen and pelvis at [facility], 1st floor Radiology. You are scheduled on 06/04/24 at 4:00pm.  Please arrive at 1:45pm.  Please follow the written instructions below on the day of your exam:   1) Do not eat anything after 12:00pm (4 hours prior to your test)   You may take any medications as prescribed with a small amount of water, if necessary. If you take any of the following medications: METFORMIN, GLUCOPHAGE, GLUCOVANCE, AVANDAMET, RIOMET, FORTAMET, ACTOPLUS MET, JANUMET, GLUMETZA or METAGLIP, you MAY be asked to HOLD this medication 48 hours AFTER the exam.   The purpose of you drinking the oral contrast is to aid in the visualization of your intestinal tract. The contrast solution may cause some diarrhea. Depending on your individual set of symptoms, you may also receive an intravenous injection of x-ray contrast/dye. Plan on being at [facility] for 45 minutes or longer, depending on the type of exam you are having performed.   If you have any questions regarding your exam or if you need to reschedule, you may call [facility] at [phone] between the hours of 8:00 am and 5:00 pm, Monday-Friday.   _______________________________________________________  If your blood pressure at your visit was 140/90 or greater, please contact your primary care physician to follow up on this.  _______________________________________________________  If you are age 24 or older, your body mass index should be between 23-30. Your Body mass index is 23.8 kg/m. If this is out of the  aforementioned range listed, please consider follow up with your Primary Care Provider.  If you are age 33 or younger, your body mass index should be between 19-25. Your Body mass index is 23.8 kg/m. If this is out of the aformentioned range listed, please consider follow up with your Primary Care Provider.   ________________________________________________________  The [facility] providers would like to encourage you to use MYCHART to communicate with providers for non-urgent requests or questions.  Due to long hold times on the telephone, sending your provider a message by [facility] may be a faster and more efficient way to get a response.  Please allow 48 business hours for a response.  Please remember that this is for non-urgent requests.  _______________________________________________________  [facility] is using a team-based approach to care.  Your team is made up of your doctor and two to three APPS. Our APPS (Nurse Practitioners and Physician Assistants) work with your physician to ensure care continuity for you. They are fully qualified to address your health concerns and develop a treatment plan. They communicate directly with your gastroenterologist to care for you. Seeing the Advanced Practice Practitioners on your physician's team can help you by facilitating care more promptly, often allowing for earlier appointments, access to diagnostic testing, procedures, and other specialty referrals.    GERD in Adults: Diet Changes When you have gastroesophageal reflux disease (GERD), you may need to make changes to your diet. Choosing the right foods can help with your symptoms. Think about working with an expert in healthy eating called a dietitian.  They can help you make healthy food choices. What are tips for following this plan? Reading food labels Look for foods that are low in saturated fat. Foods that may help with your symptoms include: Foods with less than 5% of daily  value (DV) of fat. Foods with 0 grams of trans fat. Cooking [employer] in ways that don't use a lot of fat. These ways include: Baking. Steaming. Grilling. Broiling. To add flavor, try to use herbs that are low in spice and acidity. Avoid frying your food. Meal planning  Eat small meals often rather than eating 3 large meals each day. Eat your meals slowly in a place where you feel relaxed. If told by your health care provider, avoid: Foods that cause symptoms. Keep a food diary to keep track of foods that cause symptoms. Alcohol. Drinking a lot of liquid with meals. General instructions For 2-3 hours after you eat, avoid: Bending over. Exercise. Lying down. Chew sugar-free gum after meals. What foods should I eat? Eat a healthy diet. Try to include: Foods with high amounts of fiber. These include: Fruits and vegetables. Whole grains and beans. Low-fat dairy products. Lean meats, fish, and poultry. Egg whites. Foods that cause symptoms in someone else may not cause symptoms for you. Work with your provider to find foods that are safe for you. The items listed above may not be all the foods and drinks you can have. Talk with a dietitian to learn more. The items listed above may not be a complete list of foods and beverages you can eat and drink. Contact a dietitian for more information. What foods should I avoid? Limiting some of these foods may help with your symptoms. Each person is different. Talk with a dietitian or your provider to help you find the exact foods to avoid. Some of the foods to avoid may include: Fruits Fruits with a lot of acid in them. These may include citrus fruits, such as oranges, grapefruit, pineapple, and lemons. Vegetables Deep-fried vegetables, such as [place] fries. Vegetables, sauces, or toppings made with added fat and vegetables with acid in them. These may include tomatoes and tomato products, chili peppers, onions, garlic, and  horseradish. Grains Pastries or quick breads with added fat. Meats and other proteins High-fat meats, such as fatty beef or pork, hot dogs, ribs, ham, sausage, salami, and bacon. Fried meat or protein, such as fried fish and fried chicken. Egg yolks. Fats and oils Butter. Margarine. Shortening. Ghee. Drinks Coffee and other drinks with caffeine in them. Fizzy and sugary drinks, such as soda and energy drinks. Fruit juice made with acidic fruits, such as orange or grapefruit. Tomato juice. Sweets and desserts Chocolate and cocoa. Donuts. Seasonings and condiments Mint, such as peppermint and spearmint. Condiments, herbs, or seasonings that cause symptoms. These may include curry, hot sauce, or vinegar-based salad dressings. The items listed above may not be all the foods and drinks you should avoid. Talk with a dietitian to learn more. Questions to ask your health care provider Changes to your diet and everyday life are often the first steps taken to manage symptoms of GERD. If these changes don't help, talk with your provider about taking medicines. Where to find more information [employer] Gastrointestinal Disorders: aboutgerd.org This information is not intended to replace advice given to you by your health care provider. Make sure you discuss any questions you have with your health care provider. Document Revised: 08/20/2023 Document Reviewed: 03/06/2023 Elsevier Patient Education  2024  [employer].

## 2024-05-26 ENCOUNTER — Other Ambulatory Visit

## 2024-05-26 DIAGNOSIS — R1032 Left lower quadrant pain: Secondary | ICD-10-CM

## 2024-05-26 DIAGNOSIS — R1013 Epigastric pain: Secondary | ICD-10-CM

## 2024-05-26 NOTE — Progress Notes (Signed)
 Attending Physician's Attestation   I have reviewed the chart.   I agree with the Advanced Practitioner's note, impression, and recommendations with any updates as below. Agree with updated imaging and consideration of colonoscopy for further workup of LLQ pain. Recommend H. pylori Diatherix testing, if dyspepsia is present, while she holds on repeat endoscopy.  If she is on PPI, send Diatherix testing rather than stool antigen.   [name], MD [facility] Advanced Endoscopy Office # [phone]

## 2024-05-27 ENCOUNTER — Telehealth: Payer: Self-pay | Admitting: Gastroenterology

## 2024-05-27 NOTE — Telephone Encounter (Signed)
 Please contact patient and see if she would be willing to complete a Diatherix H. pylori stool test in further evaluation of her throat irritation and stomach pain.

## 2024-05-28 ENCOUNTER — Ambulatory Visit: Payer: Self-pay | Admitting: Gastroenterology

## 2024-05-28 ENCOUNTER — Other Ambulatory Visit: Payer: Self-pay

## 2024-05-28 DIAGNOSIS — R1013 Epigastric pain: Secondary | ICD-10-CM

## 2024-05-28 DIAGNOSIS — J392 Other diseases of pharynx: Secondary | ICD-10-CM

## 2024-05-28 DIAGNOSIS — R1032 Left lower quadrant pain: Secondary | ICD-10-CM

## 2024-05-28 DIAGNOSIS — R195 Other fecal abnormalities: Secondary | ICD-10-CM

## 2024-05-28 LAB — CALPROTECTIN, FECAL: Calprotectin, Fecal: 543 ug/g — ABNORMAL HIGH (ref 0–120)

## 2024-05-28 MED ORDER — NA SULFATE-K SULFATE-MG SULF 17.5-3.13-1.6 GM/177ML PO SOLN: 1.0000 | Freq: Once | ORAL | 0 refills | Status: AC

## 2024-05-28 NOTE — Telephone Encounter (Signed)
 See result note for recommendations. Patient is scheduled for ECL with Dr. Wilhelmenia on 06/23/24 at 1:30pm.

## 2024-06-01 ENCOUNTER — Encounter: Payer: Self-pay | Admitting: Gastroenterology

## 2024-06-04 ENCOUNTER — Ambulatory Visit (HOSPITAL_COMMUNITY)
Admission: RE | Admit: 2024-06-04 | Discharge: 2024-06-04 | Disposition: A | Discharge status: Home / Self Care | Source: Ambulatory Visit | Attending: Gastroenterology | Admitting: Gastroenterology

## 2024-06-04 DIAGNOSIS — R1032 Left lower quadrant pain: Secondary | ICD-10-CM | POA: Insufficient documentation

## 2024-06-04 MED ORDER — IOHEXOL 9 MG/ML PO SOLN
ORAL | Status: AC
Start: 2024-06-04 — End: 2024-06-04
  Filled 2024-06-04: qty 1000

## 2024-06-04 MED ORDER — IOHEXOL 300 MG/ML  SOLN
100.0000 mL | Freq: Once | INTRAMUSCULAR | Status: AC | PRN
  Administered 2024-06-04: 100 mL via INTRAVENOUS

## 2024-06-04 MED ORDER — IOHEXOL 9 MG/ML PO SOLN: 1000.0000 mL | ORAL | Status: AC

## 2024-06-04 MED ORDER — IOHEXOL 350 MG/ML SOLN
100.0000 mL | Freq: Once | INTRAVENOUS | Status: DC | PRN
Start: 2024-06-04 — End: 2024-06-04

## 2024-06-04 NOTE — Progress Notes (Signed)
 Interventional Radiology Brief Note:  Patient with urticaria at injection site after contrast administration for CT imaging today. Mild itchiness at site only.  No increased work of breathing, no shortness of breath, no tongue or face swelling. Patient drove herself for appointment and does not have a ride home today.  Will initiate delayed monitoring for stability.  If no progression of site-only symptoms will plan for discharge with instructions to take 25mg  benadryl at home for ongoing itching.   Return precautions given.   Kylie Simmonds, MS RD PA-C

## 2024-06-09 ENCOUNTER — Encounter: Payer: Self-pay | Admitting: Adult Health

## 2024-06-15 ENCOUNTER — Encounter: Payer: Self-pay | Admitting: Gastroenterology

## 2024-06-23 ENCOUNTER — Encounter: Payer: Self-pay | Admitting: Gastroenterology

## 2024-06-23 ENCOUNTER — Ambulatory Visit: Admitting: Gastroenterology

## 2024-06-23 VITALS — BP 118/66 | HR 89 | Temp 98.1°F | Resp 18 | Ht 61.0 in | Wt 127.0 lb

## 2024-06-23 DIAGNOSIS — K219 Gastro-esophageal reflux disease without esophagitis: Secondary | ICD-10-CM

## 2024-06-23 DIAGNOSIS — K3189 Other diseases of stomach and duodenum: Secondary | ICD-10-CM | POA: Diagnosis not present

## 2024-06-23 DIAGNOSIS — K562 Volvulus: Secondary | ICD-10-CM

## 2024-06-23 DIAGNOSIS — K2289 Other specified disease of esophagus: Secondary | ICD-10-CM | POA: Diagnosis not present

## 2024-06-23 DIAGNOSIS — K449 Diaphragmatic hernia without obstruction or gangrene: Secondary | ICD-10-CM | POA: Diagnosis not present

## 2024-06-23 DIAGNOSIS — Q439 Congenital malformation of intestine, unspecified: Secondary | ICD-10-CM

## 2024-06-23 DIAGNOSIS — R1032 Left lower quadrant pain: Secondary | ICD-10-CM | POA: Diagnosis not present

## 2024-06-23 DIAGNOSIS — R1013 Epigastric pain: Secondary | ICD-10-CM

## 2024-06-23 MED ORDER — HYOSCYAMINE SULFATE 0.125 MG SL SUBL: 0.1250 mg | SUBLINGUAL_TABLET | Freq: Three times a day (TID) | SUBLINGUAL | 0 refills | Status: DC

## 2024-06-23 MED ORDER — SODIUM CHLORIDE 0.9 % IV SOLN: 500.0000 mL | INTRAVENOUS | Status: DC

## 2024-06-23 NOTE — Op Note (Addendum)
 [facility] Patient Name: Charlotte Giles Procedure Date: 06/23/2024 2:12 PM MRN: [id number] Endoscopist: [name] , MD, [id number] Age: 38 Referring MD:  Date of Birth: [birth year] Gender: Female Account #: [id number] Procedure:                Colonoscopy Indications:              Abdominal pain in the left lower quadrant Medicines:                Monitored Anesthesia Care Procedure:                Pre-Anesthesia Assessment:                           - Prior to the procedure, a History and Physical                            was performed, and patient medications and                            allergies were reviewed. The patient's tolerance of                            previous anesthesia was also reviewed. The risks                            and benefits of the procedure and the sedation                            options and risks were discussed with the patient.                            All questions were answered, and informed consent                            was obtained. Prior Anticoagulants: The patient has                            taken no anticoagulant or antiplatelet agents. ASA                            Grade Assessment: II - A patient with mild systemic                            disease. After reviewing the risks and benefits,                            the patient was deemed in satisfactory condition to                            undergo the procedure.                           After obtaining informed consent, the colonoscope  was passed under direct vision. Throughout the                            procedure, the patient's blood pressure, pulse, and                            oxygen saturations were monitored continuously. The                            CF [id number] #[id number] was introduced through the anus                            and advanced to the the cecum, identified by                            appendiceal  orifice and ileocecal valve. The                            colonoscopy was somewhat difficult due to                            significant looping and a tortuous colon.                            Successful completion of the procedure was aided by                            changing the patient's position, using manual                            pressure, straightening and shortening the scope to                            obtain bowel loop reduction and using scope                            torsion. The patient tolerated the procedure. The                            quality of the bowel preparation was good. The                            ileocecal valve, appendiceal orifice, and rectum                            were photographed. Scope In: 2:29:22 PM Scope Out: 2:39:12 PM Scope Withdrawal Time: 0 hours 5 minutes 33 seconds  Total Procedure Duration: 0 hours 9 minutes 50 seconds  Findings:                 The digital rectal exam was normal. Pertinent                            negatives include no palpable rectal lesions.  The colon (entire examined portion) was moderately                            tortuous leading to looping.                           Normal mucosa was found in the entire colon.                           Non-bleeding non-thrombosed internal hemorrhoids                            were found during retroflexion. The hemorrhoids                            were Grade I (internal hemorrhoids that do not                            prolapse). Complications:            No immediate complications. Estimated Blood Loss:     Estimated blood loss was minimal. Estimated blood                            loss: none. Impression:               - Tortuous colon leading to looping.                           - Normal mucosa in the entire examined colon.                           - Non-bleeding non-thrombosed internal hemorrhoids. Recommendation:           -  The patient will be observed post-procedure,                            until all discharge criteria are met.                           - Discharge patient to home.                           - Patient has a contact number available for                            emergencies. The signs and symptoms of potential                            delayed complications were discussed with the                            patient. Return to normal activities tomorrow.                            Written discharge instructions were provided to the  patient.                           - High fiber diet.                           - Use FiberCon 1-2 tablets PO daily.                           - For LLQ pain that is constantly present, we will                            trial Levsin  2-3 times daily for the next few                            weeks. If helpful further prescriptions can be                            sent. We will plan to consider TCA for                            neuromodulation in future if functional abdominal                            pain persists as this could be reasoning for                            symptoms.                           - Continue present medications.                           - Repeat colonoscopy in 10 years for screening                            purposes.                           - The findings and recommendations were discussed                            with the patient. [name], MD 06/23/2024 2:46:29 PM

## 2024-06-23 NOTE — Patient Instructions (Addendum)
 Try to take fiber con 1-2 tablets per day. Resume all  of your previous medications today as ordered. Read all of your discharge instructions. Use your levsin  up to three times a day per cramping as needed. It's at your pharmacy. Be sure to drink plenty of water to prevent constipation. Call us  in three weeks for an update.   YOU HAD AN ENDOSCOPIC PROCEDURE TODAY AT THE [facility]:   Refer to the procedure report that was given to you for any specific questions about what was found during the examination.  If the procedure report does not answer your questions, please call your gastroenterologist to clarify.  If you requested that your care partner not be given the details of your procedure findings, then the procedure report has been included in a sealed envelope for you to review at your convenience later.  YOU SHOULD EXPECT: Some feelings of bloating in the abdomen. Passage of more gas than usual.  Walking can help get rid of the air that was put into your GI tract during the procedure and reduce the bloating. If you had a lower endoscopy (such as a colonoscopy or flexible sigmoidoscopy) you may notice spotting of blood in your stool or on the toilet paper. If you underwent a bowel prep for your procedure, you may not have a normal bowel movement for a few days.  Please Note:  You might notice some irritation and congestion in your nose or some drainage.  This is from the oxygen used during your procedure.  There is no need for concern and it should clear up in a day or so.  SYMPTOMS TO REPORT IMMEDIATELY:  Following lower endoscopy (colonoscopy or flexible sigmoidoscopy):  Excessive amounts of blood in the stool  Significant tenderness or worsening of abdominal pains  Swelling of the abdomen that is new, acute  Fever of 100F or higher  Following upper endoscopy (EGD)  Vomiting of blood or coffee ground material  New chest pain or pain under the shoulder blades  Painful or  persistently difficult swallowing  New shortness of breath  Fever of 100F or higher  Black, tarry-looking stools  For urgent or emergent issues, a gastroenterologist can be reached at any hour by calling [phone]. Do not use MyChart messaging for urgent concerns.    DIET:  We do recommend a small meal at first, but then you may proceed to your regular diet.  Drink plenty of fluids but you should avoid alcoholic beverages for 24 hours.  ACTIVITY:  You should plan to take it easy for the rest of today and you should NOT DRIVE or use heavy machinery until tomorrow (because of the sedation medicines used during the test).    FOLLOW UP: Our staff will call the number listed on your records the next business day following your procedure.  We will call around 7:15- 8:00 am to check on you and address any questions or concerns that you may have regarding the information given to you following your procedure. If we do not reach you, we will leave a message.     If any biopsies were taken you will be contacted by phone or by letter within the next 1-3 weeks.  Please call us  at [phone] if you have not heard about the biopsies in 3 weeks.    SIGNATURES/CONFIDENTIALITY: You and/or your care partner have signed paperwork which will be entered into your electronic medical record.  These signatures attest to the fact that  that the information above on your After Visit Summary has been reviewed and is understood.  Full responsibility of the confidentiality of this discharge information lies with you and/or your care-partner.

## 2024-06-23 NOTE — Op Note (Signed)
 [facility] Patient Name: Charlotte Giles Procedure Date: 06/23/2024 2:13 PM MRN: [id number] Endoscopist: [name] , MD, [id number] Age: 38 Referring MD:  Date of Birth: Mar 06, 1986 Gender: Female Account #: [id number] Procedure:                Upper GI endoscopy Indications:              Abdominal pain in the left lower quadrant,                            Heartburn, Dyspepsia, Nausea Medicines:                Monitored Anesthesia Care Procedure:                Pre-Anesthesia Assessment:                           - Prior to the procedure, a History and Physical                            was performed, and patient medications and                            allergies were reviewed. The patient's tolerance of                            previous anesthesia was also reviewed. The risks                            and benefits of the procedure and the sedation                            options and risks were discussed with the patient.                            All questions were answered, and informed consent                            was obtained. Prior Anticoagulants: The patient has                            taken no anticoagulant or antiplatelet agents. ASA                            Grade Assessment: II - A patient with mild systemic                            disease. After reviewing the risks and benefits,                            the patient was deemed in satisfactory condition to                            undergo the procedure.  After obtaining informed consent, the endoscope was                            passed under direct vision. Throughout the                            procedure, the patient's blood pressure, pulse, and                            oxygen saturations were monitored continuously. The                            Olympus Scope SN [id number] was introduced through the                            mouth, and advanced to the  second part of duodenum.                            The upper GI endoscopy was accomplished without                            difficulty. The patient tolerated the procedure. Scope In: Scope Out: Findings:                 No gross lesions were noted in the entire esophagus.                           The Z-line was irregular and was found 35 cm from                            the incisors.                           A 2 cm hiatal hernia was present.                           Patchy mildly erythematous mucosa without bleeding                            was found in the entire examined stomach. Biopsies                            were taken with a cold forceps for histology and                            Helicobacter pylori testing.                           No gross lesions were noted in the duodenal bulb,                            in the first portion of the duodenum and in the  second portion of the duodenum. Biopsies were taken                            with a cold forceps for histology. Complications:            No immediate complications. Estimated Blood Loss:     Estimated blood loss was minimal. Impression:               - No gross lesions in the entire esophagus. Z-line                            irregular, 35 cm from the incisors.                           - 2 cm hiatal hernia.                           - Erythematous mucosa in the stomach. Biopsied.                           - No gross lesions in the duodenal bulb, in the                            first portion of the duodenum and in the second                            portion of the duodenum. Biopsied. Recommendation:           - Proceed to scheduled colonoscopy.                           - Continue present medications.                           - Await pathology results.                           - The findings and recommendations were discussed                            with the  patient. [name], MD 06/23/2024 2:43:49 PM

## 2024-06-23 NOTE — Progress Notes (Signed)
 GASTROENTEROLOGY PROCEDURE H&P NOTE   Primary Care Physician: Charlotte Huxley, NP  HPI: Charlotte Giles is a 38 y.o. female who presents for EGD/Colonoscopy for evaluation of LLQ abdominal pain, GERD, sore throat, dyspepsia.  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     pantoprazole  (PROTONIX ) 40 MG tablet Take 1 tablet (40 mg total) by mouth daily. 30 tablet 1   Prenatal Vit-Fe Fumarate-FA (PRENATAL VITAMINS) 27-0.8 MG TABS Take by mouth.     No current facility-administered medications for this visit.    Current Outpatient Medications:    acetaminophen  (TYLENOL ) 650 MG CR tablet, Take 650 mg by mouth every 8 (eight) hours as needed for pain., Disp: , Rfl:    pantoprazole  (PROTONIX ) 40 MG tablet, Take 1 tablet (40 mg total) by mouth daily., Disp: 30 tablet, Rfl: 1   Prenatal Vit-Fe Fumarate-FA (PRENATAL VITAMINS) 27-0.8 MG TABS, Take by mouth., Disp: , Rfl:  No Known Allergies Family History  Problem Relation Age of Onset   Hypertension Mother    Ovarian cancer Mother    Cancer Father 52       PROSTATE   Sudden Cardiac Death Maternal Uncle 66       MI    Diabetes Maternal Grandmother    Hypertension Maternal Grandmother    Breast cancer Neg Hx    Social History   Socioeconomic History   Marital status: Married    Spouse name: Not on file   Number of children: 3   Years of education: Not on file   Highest education level: Not  on file  Occupational History   Occupation: nurse   Occupation: nurse  Tobacco Use   Smoking status: Never   Smokeless tobacco: Never  Vaping Use   Vaping status: Never Used  Substance and Sexual Activity   Alcohol use: Yes    Alcohol/week: 0.0 standard drinks of alcohol    Comment: social    Drug use: No   Sexual activity: Yes    Partners: Male    Birth control/protection: Condom    Comment: 1st intercourse- 18, partners- 2, married- 8 yrs   Other Topics Concern   Not on file  Social History Narrative    RN    Two children - 4 and 2    Married - 6 years       Social Drivers of Corporate investment banker Strain: Not on file  Food Insecurity: No Food Insecurity (09/03/2022)   Hunger Vital Sign    Worried About Running Out of Food in the Last Year: Never true    Ran Out of Food in the Last Year: Never true  Transportation Needs: No Transportation Needs (09/03/2022)   PRAPARE - Administrator, Civil Service (Medical): No    Lack of Transportation (Non-Medical): No  Physical Activity: Not on file  Stress: Not on file  Social Connections: Not on  file  Intimate Partner Violence: Not on file    Physical Exam: There were no vitals filed for this visit. There is no height or weight on file to calculate BMI. GEN: NAD EYE: Sclerae anicteric ENT: MMM CV: Non-tachycardic GI: Soft, NT/ND NEURO:  Alert & Oriented x 3  Lab Results: No results for input(s): WBC, HGB, HCT, PLT in the last 72 hours. BMET No results for input(s): NA, K, CL, CO2, GLUCOSE, BUN, CREATININE, CALCIUM in the last 72 hours. LFT No results for input(s): PROT, ALBUMIN, AST, ALT, ALKPHOS, BILITOT, BILIDIR, IBILI in the last 72 hours. PT/INR No results for input(s): LABPROT, INR in the last 72 hours.   Impression / Plan: This is a 38 y.o.female who presents for EGD/Colonoscopy for evaluation of LLQ abdominal pain, GERD, sore throat,  dyspepsia.  The risks and benefits of endoscopic evaluation/treatment were discussed with the patient and/or family; these include but are not limited to the risk of perforation, infection, bleeding, missed lesions, lack of diagnosis, severe illness requiring hospitalization, as well as anesthesia and sedation related illnesses.  The patient's history has been reviewed, patient examined, no change in status, and deemed stable for procedure.  The patient and/or family is agreeable to proceed.    [name], MD [facility] Advanced Endoscopy Office # [phone]

## 2024-06-23 NOTE — Progress Notes (Signed)
 Vss nad trans to pacu

## 2024-06-23 NOTE — Progress Notes (Signed)
 Called to room to assist during endoscopic procedure.  Patient ID and intended procedure confirmed with present staff. Received instructions for my participation in the procedure from the performing physician.

## 2024-06-23 NOTE — Progress Notes (Signed)
 Pt's states no medical or surgical changes since previsit or office visit.

## 2024-06-24 ENCOUNTER — Telehealth: Payer: Self-pay | Admitting: *Deleted

## 2024-06-24 NOTE — Telephone Encounter (Signed)
  Follow up Call-     06/23/2024    1:28 PM  Call back number  Post procedure Call Back phone  # [phone]  Permission to leave phone message Yes     Patient questions:  Do you have a fever, pain , or abdominal swelling? No. Pain Score  0 *  Have you tolerated food without any problems? Yes.    Have you been able to return to your normal activities? Yes.    Do you have any questions about your discharge instructions: Diet   No. Medications  No. Follow up visit  No.  Do you have questions or concerns about your Care? No.  Actions: * If pain score is 4 or above: No action needed, pain <4.

## 2024-06-26 LAB — SURGICAL PATHOLOGY

## 2024-06-27 ENCOUNTER — Ambulatory Visit: Payer: Self-pay | Admitting: Gastroenterology

## 2024-06-30 ENCOUNTER — Other Ambulatory Visit (HOSPITAL_COMMUNITY)

## 2024-07-03 ENCOUNTER — Encounter: Payer: Self-pay | Admitting: Cardiology

## 2024-07-04 ENCOUNTER — Other Ambulatory Visit: Payer: Self-pay | Admitting: Gastroenterology

## 2024-07-04 DIAGNOSIS — R1013 Epigastric pain: Secondary | ICD-10-CM

## 2024-07-04 DIAGNOSIS — R1032 Left lower quadrant pain: Secondary | ICD-10-CM

## 2024-07-10 ENCOUNTER — Ambulatory Visit: Admitting: Gastroenterology

## 2024-07-13 ENCOUNTER — Telehealth: Payer: Self-pay | Admitting: Cardiology

## 2024-07-13 NOTE — Telephone Encounter (Signed)
 Spoke with patient. She has a home BP cuff. Advised will change appointment type. She will check BP, pulse, weight day of video visit.

## 2024-07-13 NOTE — Telephone Encounter (Signed)
 Message routed to [facility] RN/Dr. Tobb to advise

## 2024-07-13 NOTE — Telephone Encounter (Signed)
 Patient would like to know if her 12/10 appt can be done virtually.

## 2024-08-13 ENCOUNTER — Ambulatory Visit (INDEPENDENT_AMBULATORY_CARE_PROVIDER_SITE_OTHER): Admitting: Gastroenterology

## 2024-08-13 ENCOUNTER — Encounter: Payer: Self-pay | Admitting: Gastroenterology

## 2024-08-13 VITALS — BP 108/70 | HR 64 | Ht 61.0 in | Wt 129.4 lb

## 2024-08-13 DIAGNOSIS — R1013 Epigastric pain: Secondary | ICD-10-CM

## 2024-08-13 DIAGNOSIS — R1032 Left lower quadrant pain: Secondary | ICD-10-CM

## 2024-08-13 MED ORDER — HYOSCYAMINE SULFATE 0.125 MG SL SUBL
0.1250 mg | SUBLINGUAL_TABLET | Freq: Three times a day (TID) | SUBLINGUAL | 3 refills | Status: AC
Start: 2024-08-13 — End: ?

## 2024-08-13 NOTE — Progress Notes (Signed)
 "  Charlotte Giles [id number] 1986-10-22   Chief Complaint: Abdominal pain, nausea, dyspepsia  Referring Provider: Merna Huxley, NP Primary GI MD: Dr. Wilhelmenia  HPI: Charlotte Giles is a 38 y.o. female with past medical history of acute blood loss anemia 2015, appendectomy 2019, hernia repair  who presents today for follow up.    Seen by PCP 03/19/2024.  At that time reported ongoing LLQ abdominal pain, which had been a problem for multiple years.  Workup including blood work, KUB, CT abdomen, and transvaginal ultrasound without revealing results.  Had also developed nausea and throat irritation.  Some improvement with OTC acid reflux meds.   Labs 03/19/2024: Normal CBC, CMP, lipid panel, TSH  At last visit 05/25/2024 patient reported constant LLQ abdominal pain, with previous workup unrevealing.  No improvement with physical therapy.  No prior colonoscopy.  Was tender to palpation on exam, CT A/P was ordered with plan for colonoscopy if unrevealing. Also endorsed throat irritation worse with spicy foods, better with drinking alkaline water.  No improvement with Pepcid OTC.  Protonix  caused brain fog so she stopped taking.  Advised OTC Pepcid as needed, lifestyle mods for GERD, consideration of EGD.  Fecal calprotectin was elevated and colonoscopy recommended for further evaluation, with addition of EGD due to upper GI symptoms.  CT A/P 06/04/2024 showed no acute findings, stable benign hepatic and angioma.  She underwent EGD/colonoscopy 06/23/2024.  On colonoscopy found to have a tortuous colon, nonbleeding nonthrombosed internal hemorrhoids, recall recommended in 10 years.  On EGD found to have a 2 cm hiatal hernia, erythematous mucosa in the stomach which was biopsied with no specific pathologic diagnosis and negative for H. pylori.  Advised to start FiberCon, high-fiber diet, trial Levsin  2-3 times daily.   Discussed the use of AI scribe software for clinical note transcription  with the patient, who gave verbal consent to proceed.  History of Present Illness Charlotte Giles is a 38 year old female with irritable bowel syndrome who presents with persistent abdominal pain and gastrointestinal symptoms.  She experiences persistent left lower abdominal pain, rated as 3 or 4 out of 10 in severity. The pain is constant, not related to her menstrual cycles, and is more noticeable when sitting or lying down. She manages the symptoms with alkaline water with lime, which she finds helpful. Previous colonoscopy showed no inflammation, and her bowel movements are regular.  She experiences nausea and indigestion, particularly associated with certain foods like eggs. She has tried Pepcid and Protonix  in the past but currently manages her symptoms with alkaline water. An upper endoscopy revealed a small hiatal hernia  but biopsies were normal. She has not been diagnosed with H. pylori infection. She has not had her gallbladder removed, and there is no family history of gallbladder disease.  Her menstrual cycles are changing, becoming longer. She denies any significant urinary symptoms. She is finishing her menstrual cycle at the time of the visit.  She has tried physical therapy in the past without significant relief. She has not tried FDgard but has used IBgard, which helps but is difficult to swallow.   Previous GI Procedures/Imaging   Colonoscopy 06/23/2024 - Tortuous colon leading to looping.  - Normal mucosa in the entire examined colon.  - Non-bleeding non-thrombosed internal hemorrhoids. - Recall 10 years  EGD 06/23/2024 - No gross lesions in the entire esophagus. Z- line irregular, 35 cm from the incisors.  - 2 cm hiatal hernia.  - Erythematous mucosa in the stomach.  Biopsied.  - No gross lesions in the duodenal bulb, in the first portion of the duodenum and in the second portion of the duodenum. Biopsied. Path: 1. Surgical [P], random gastric sites :       -GASTRIC  MUCOSA WITH NO SPECIFIC PATHOLOGIC DIAGNOSIS.       -NO INFLAMMATORY PATTERN PREDICTIVE OF HELICOBACTER PYLORI INFECTION IDENTIFIED.       -NEGATIVE FOR INTESTINAL METAPLASIA AND MALIGNANCY.        2. Surgical [P], duodenum :       -DUODENAL MUCOSA WITH NO SPECIFIC PATHOLOGIC DIAGNOSIS.       -NORMAL LONG AND SLENDER VILLI WITHOUT IDENTIFIED PATHOLOGIC INTRAEPITHELIAL       LYMPHOCYTOSIS.   CT A/P 06/04/2024 showed no acute findings, stable benign hepatic and angioma.  CT A/P 11/28/2020 1. No CT findings to explain the patient's left lower quadrant pain. 2. Moderately distended stomach.   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     hyoscyamine  (LEVSIN  SL) 0.125 MG SL tablet Place 1 tablet (0.125 mg total) under the tongue 3 (three) times daily before meals. 45 tablet 0   Prenatal Vit-Fe Fumarate-FA (PRENATAL VITAMINS) 27-0.8 MG TABS Take by mouth.     No current facility-administered medications for this visit.    Allergies as of 08/13/2024   (No Known Allergies)    Family History  Problem Relation Age of Onset   Hypertension Mother    Ovarian cancer Mother    Cancer Mother    Cancer Father 44       PROSTATE   Sudden Cardiac Death Maternal Uncle 34       MI    Diabetes Maternal Grandmother    Hypertension Maternal Grandmother    Breast cancer Neg Hx    Colon cancer Neg Hx    Esophageal cancer Neg Hx    Rectal cancer Neg Hx     Stomach cancer Neg Hx     Social History   Tobacco Use   Smoking status: Never   Smokeless tobacco: Never  Vaping Use   Vaping status: Never Used  Substance Use Topics   Alcohol use: Yes    Alcohol/week: 0.0 standard drinks of alcohol    Comment: social    Drug use: No     Review of Systems:    Constitutional: No weight loss, fever, chills Cardiovascular: No chest pain Respiratory: No SOB  Gastrointestinal: See HPI and otherwise negative   Physical Exam:  Vital signs: BP 108/70   Pulse 64   Ht 5' 1 (1.549 m)   Wt 129 lb 6.4 oz (58.7 kg)   BMI 24.45 kg/m   Constitutional: Pleasant, well-appearing female in NAD, alert and cooperative Head:  Normocephalic and atraumatic.  Eyes: No scleral icterus.  Respiratory: Respirations even and unlabored. Lungs clear to auscultation bilaterally.  No wheezes, crackles, or rhonchi.  Cardiovascular:  Regular rate and  rhythm. No murmurs. No peripheral edema. Gastrointestinal:  Soft, nondistended, minimal tenderness to palpation of suprapubic area. No rebound or guarding. Normal bowel sounds. No appreciable masses or hepatomegaly. Rectal:  Not performed.  Neurologic:  Alert and oriented x4;  grossly normal neurologically.  Skin:   Dry and intact without significant lesions or rashes. Psychiatric: Oriented to person, place and time. Demonstrates good judgement and reason without abnormal affect or behaviors.   RELEVANT LABS AND IMAGING: CBC    Component Value Date/Time   WBC 8.5 03/19/2024 0850   RBC 4.44 03/19/2024 0850   HGB 12.7 03/19/2024 0850   HCT 39.0 03/19/2024 0850   PLT 261.0 03/19/2024 0850   MCV 87.8 03/19/2024 0850   MCH 31.7 02/26/2022 0548   MCHC 32.7 03/19/2024 0850   RDW 13.5 03/19/2024 0850   LYMPHSABS 2.3 03/19/2024 0850   MONOABS 0.8 03/19/2024 0850   EOSABS 0.1 03/19/2024 0850   BASOSABS 0.0 03/19/2024 0850    CMP     Component Value Date/Time   NA 137 03/19/2024 0850   K 3.8 03/19/2024 0850   CL  103 03/19/2024 0850   CO2 28 03/19/2024 0850   GLUCOSE 84 03/19/2024 0850   BUN 14 03/19/2024 0850   CREATININE 0.92 03/19/2024 0850   CREATININE 0.97 11/11/2020 1607   CALCIUM 9.5 03/19/2024 0850   PROT 7.3 03/19/2024 0850   ALBUMIN 4.5 03/19/2024 0850   AST 17 03/19/2024 0850   ALT 14 03/19/2024 0850   ALKPHOS 47 03/19/2024 0850   BILITOT 0.7 03/19/2024 0850   GFRNONAA >60 02/27/2018 0600   GFRAA >60 02/27/2018 0600     Assessment/Plan:   Dyspepsia LLQ abdominal pain Patient seen today for follow-up.  Continues to have dyspepsia which she is currently managing with alkaline water.  Does have some nausea, particularly with eggs.  States she has been tested for egg allergy in the past and was negative.  Also continues to have intermittent LLQ pain.  Workup for this has been unremarkable.  CT scan showed no acute findings.  Colonoscopy showed a tortuous colon and otherwise normal.  Recent EGD also reassuring. She does have improvement in LLQ pain with Levsin  and we will send a refill of this for her.  She felt that IBgard was helpful in the past but found it difficult to swallow.  She is interested in trying FDgard for her dyspepsia so we will give samples of this today.  - Refill Levsin  - Continue high fiber diet - Samples of Fdgard given - Follow up as needed   Camie Furbish, PA-C [facility] 08/13/2024, 10:25 AM  Patient Care Team: Merna Huxley, NP as PCP - General (Family Medicine) Lavoie, Marie-Lyne, MD as Consulting Physician (Gynecology) Rutherford Gain, MD as Consulting Physician (Obstetrics) Tobb, Kardie, DO as Consulting Physician (Cardiology)   "

## 2024-08-13 NOTE — Patient Instructions (Addendum)
 We have given you samples of the following medication to take: Fdgard   _______________________________________________________  If your blood pressure at your visit was 140/90 or greater, please contact your primary care physician to follow up on this.  _______________________________________________________  If you are age 37 or older, your body mass index should be between 23-30. Your Body mass index is 24.45 kg/m. If this is out of the aforementioned range listed, please consider follow up with your Primary Care Provider.  If you are age 81 or younger, your body mass index should be between 19-25. Your Body mass index is 24.45 kg/m. If this is out of the aformentioned range listed, please consider follow up with your Primary Care Provider.   ________________________________________________________  The [facility] providers would like to encourage you to use MYCHART to communicate with providers for non-urgent requests or questions.  Due to long hold times on the telephone, sending your provider a message by [facility] may be a faster and more efficient way to get a response.  Please allow 48 business hours for a response.  Please remember that this is for non-urgent requests.  _______________________________________________________  [facility] is using a team-based approach to care.  Your team is made up of your doctor and two to three APPS. Our APPS (Nurse Practitioners and Physician Assistants) work with your physician to ensure care continuity for you. They are fully qualified to address your health concerns and develop a treatment plan. They communicate directly with your gastroenterologist to care for you. Seeing the Advanced Practice Practitioners on your physician's team can help you by facilitating care more promptly, often allowing for earlier appointments, access to diagnostic testing, procedures, and other specialty referrals.

## 2024-08-14 ENCOUNTER — Encounter: Payer: Self-pay | Admitting: Gastroenterology

## 2024-08-17 NOTE — Progress Notes (Signed)
 Attending Physician's Attestation   I have reviewed the chart.   I agree with the Advanced Practitioner's note, impression, and recommendations with any updates as below.    Corliss Parish, MD [facility] Advanced Endoscopy Office # [phone]

## 2024-09-30 ENCOUNTER — Telehealth: Admitting: Cardiology

## 2024-11-25 ENCOUNTER — Ambulatory Visit: Admitting: Cardiology

## 2024-11-25 ENCOUNTER — Encounter: Payer: Self-pay | Admitting: Cardiology

## 2024-11-25 VITALS — BP 98/64 | HR 91 | Ht 61.0 in | Wt 125.0 lb

## 2024-11-25 DIAGNOSIS — I471 Supraventricular tachycardia, unspecified: Secondary | ICD-10-CM

## 2024-11-25 DIAGNOSIS — R002 Palpitations: Secondary | ICD-10-CM

## 2024-11-25 NOTE — Progress Notes (Unsigned)
 "  Cardio-Obstetrics Clinic  New Evaluation  Date:  11/25/2024   ID:  Charlotte Giles, DOB 02-10-1986, MRN [id number]  PCP:  Merna Huxley, NP   [facility] Providers Cardiologist:  None  Electrophysiologist:  None       Referring MD: Merna Huxley, NP   Chief Complaint:  I am doing well  History of Present Illness:    Charlotte Giles is a 39 y.o. female [G3P3003] who is being seen today for   Medical postpartum hypertension, iron deficiency anemia here today for follow-up visit.  At her last visit she told me she was experiencing palpitation I placed a monitor on the patient.  Virtual Visit via Video  Note . I connected with the patient today by a   video enabled telemedicine application and verified that I am speaking with the correct person using two identifiers.  She is at home. In the clinic.  She does have the palpitation has improved.  It is not worse.  Prior CV Studies Reviewed: The following studies were reviewed today: Reviewed her echo as well as her ZIO monitor.  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   hyoscyamine  (LEVSIN  SL) 0.125 MG SL tablet Place 1 tablet (0.125 mg total) under the tongue 3 (three) times daily before  meals.   Prenatal Vit-Fe Fumarate-FA (PRENATAL VITAMINS) 27-0.8 MG TABS Take by mouth.     Allergies:   Patient has no known allergies.   Social History   Socioeconomic History   Marital status: Married    Spouse name: Not on file   Number of children: 3   Years of education: Not on file   Highest education level: Bachelor's degree (e.g., BA, AB, BS)  Occupational History   Occupation: nurse   Occupation: nurse  Tobacco Use   Smoking status: Never   Smokeless tobacco: Never  Vaping Use   Vaping status: Never Used  Substance and Sexual Activity   Alcohol use: Yes    Alcohol/week: 0.0 standard drinks of alcohol    Comment: social    Drug use: No   Sexual activity: Yes    Partners: Male    Birth control/protection: Condom    Comment: 1st intercourse- 18, partners- 2, married- 8 yrs   Other Topics Concern   Not on file  Social History Narrative    RN    Two children - 4 and 2  Married - 6 years       Social Drivers of Health   Tobacco Use: Low Risk (11/25/2024)   Patient History    Smoking Tobacco Use: Never    Smokeless Tobacco Use: Never    Passive Exposure: Not on file  Financial Resource Strain: Low Risk (11/23/2024)   Overall Financial Resource Strain (CARDIA)    Difficulty of Paying Living Expenses: Not hard at all  Food Insecurity: No Food Insecurity (11/23/2024)   Epic    Worried About Radiation Protection Practitioner of Food in the Last Year: Never true    Ran Out of Food in the Last Year: Never true  Transportation Needs: No Transportation Needs (11/23/2024)   Epic    Lack of Transportation (Medical): No    Lack of Transportation (Non-Medical): No  Physical Activity: Sufficiently Active (11/23/2024)   Exercise Vital Sign    Days of Exercise per Week: 4 days    Minutes of Exercise per Session: 150+ min  Stress: No Stress Concern Present (11/23/2024)   [employer] of Occupational Health - Occupational Stress Questionnaire    Feeling of Stress: Not at all  Social  Connections: Socially Integrated (11/23/2024)   Social Connection and Isolation Panel    Frequency of Communication with Friends and Family: More than three times a week    Frequency of Social Gatherings with Friends and Family: Patient declined    Attends Religious Services: More than 4 times per year    Active Member of Clubs or Organizations: Yes    Attends Banker Meetings: More than 4 times per year    Marital Status: Married  Depression (PHQ2-9): Low Risk (03/13/2023)   Depression (PHQ2-9)    PHQ-2 Score: 1  Alcohol Screen: Low Risk (11/23/2024)   Alcohol Screen    Last Alcohol Screening Score (AUDIT): 1  Housing: Low Risk (11/23/2024)   Epic    Unable to Pay for Housing in the Last Year: No    Number of Times Moved in the Last Year: 0    Homeless in the Last Year: No  Utilities: Not At Risk (09/03/2022)   AHC Utilities    Threatened with loss of utilities: No  Health Literacy: Not on file      Family History  Problem Relation Age of Onset   Hypertension Mother    Ovarian cancer Mother    Cancer Mother    Cancer Father 32       PROSTATE   Sudden Cardiac Death Maternal Uncle 2035-12-22       MI    Diabetes Maternal Grandmother    Hypertension Maternal Grandmother    Breast cancer Neg Hx    Colon cancer Neg Hx    Esophageal cancer Neg Hx    Rectal cancer Neg Hx    Stomach cancer Neg Hx       ROS:   Please see the history of present illness.    Palpitations All other systems reviewed and are negative.   Labs/EKG Reviewed:    EKG:   None today  Recent Labs: 03/19/2024: ALT 14; BUN 14; Creatinine, Ser 0.92; Hemoglobin 12.7; Platelets 261.0; Potassium 3.8; Sodium 137; TSH 1.16   Recent Lipid Panel Lab Results  Component Value Date/Time   CHOL 134 03/19/2024 08:50 AM   TRIG 50.0 03/19/2024 08:50 AM   HDL 46.50 03/19/2024 08:50 AM   CHOLHDL 3 03/19/2024 08:50 AM   LDLCALC 78 03/19/2024 08:50 AM    Physical Exam:    VS:  BP 98/64 (BP Location: Left Arm,  Patient Position: Sitting, Cuff Size: Normal)   Pulse 91   Ht 5' 1 (1.549 m)   Wt 125 lb (56.7 kg)   SpO2 98%   BMI 23.62 kg/m     Wt Readings from Last 3 Encounters:  11/25/24 125 lb (56.7 kg)  08/13/24 129 lb 6.4 oz (58.7 kg)  06/23/24 127 lb (57.6 kg)     GEN:  Well nourished, well developed in no acute distress HEENT: Normal NECK: No JVD; No carotid bruits LYMPHATICS: No lymphadenopathy CARDIAC: RRR, no murmurs, rubs, gallops RESPIRATORY:  Clear to auscultation without rales, wheezing or rhonchi  ABDOMEN: Soft, non-tender, non-distended MUSCULOSKELETAL:  No edema; No deformity  SKIN: Warm and dry NEUROLOGIC:  Alert and oriented x 3 PSYCHIATRIC:  Normal affect    Risk Assessment/Risk Calculators:           { Click for CHADS2VASc Score - THEN Refresh Note    :[id number]}      ASSESSMENT & PLAN:    P\SVT Postpartum hypertension  Her blood is normal in the office.  No need to change her antihypertensive regimen at this time. We talked about her monitor results.  Will not start any medication at this time.  Will continue to monitor the patient.  There are no Patient Instructions on file for this visit.   Dispo:  No follow-ups on file.   Medication Adjustments/Labs and Tests Ordered: Current medicines are reviewed at length with the patient today.  Concerns regarding medicines are outlined above.  Tests Ordered: Orders Placed This Encounter  Procedures   EKG 12-Lead   Medication Changes: No orders of the defined types were placed in this encounter.  "

## 2024-11-25 NOTE — Patient Instructions (Signed)
 Medication Instructions:  Your physician recommends that you continue on your current medications as directed. Please refer to the Current Medication list given to you today.  *If you need a refill on your cardiac medications before your next appointment, please call your pharmacy*   Follow-Up: At [facility], you and your health needs are our priority.  As part of our continuing mission to provide you with exceptional heart care, our providers are all part of one team.  This team includes your primary Cardiologist (physician) and Advanced Practice Providers or APPs (Physician Assistants and Nurse Practitioners) who all work together to provide you with the care you need, when you need it.  Your next appointment:    As needed  Provider:   Kardie Tobb, DO    Other Instructions:
